# Patient Record
Sex: Male | Born: 1965 | Race: White | Hispanic: No | Marital: Single | Smoking: Never smoker
Health system: Southern US, Community
[De-identification: ages and names within clinical notes are randomized; demographics above are authoritative.]

## PROBLEM LIST (undated history)

## (undated) DIAGNOSIS — B1920 Unspecified viral hepatitis C without hepatic coma: Secondary | ICD-10-CM

## (undated) DIAGNOSIS — F329 Major depressive disorder, single episode, unspecified: Secondary | ICD-10-CM

## (undated) DIAGNOSIS — F32A Depression, unspecified: Secondary | ICD-10-CM

## (undated) DIAGNOSIS — K279 Peptic ulcer, site unspecified, unspecified as acute or chronic, without hemorrhage or perforation: Secondary | ICD-10-CM

## (undated) DIAGNOSIS — F319 Bipolar disorder, unspecified: Secondary | ICD-10-CM

## (undated) DIAGNOSIS — G709 Myoneural disorder, unspecified: Secondary | ICD-10-CM

## (undated) DIAGNOSIS — F509 Eating disorder, unspecified: Secondary | ICD-10-CM

## (undated) DIAGNOSIS — F419 Anxiety disorder, unspecified: Secondary | ICD-10-CM

## (undated) HISTORY — DX: Major depressive disorder, single episode, unspecified: F32.9

## (undated) HISTORY — DX: Eating disorder, unspecified: F50.9

## (undated) HISTORY — DX: Unspecified viral hepatitis C without hepatic coma: B19.20

## (undated) HISTORY — DX: Depression, unspecified: F32.A

## (undated) HISTORY — PX: TONSILLECTOMY: SUR1361

## (undated) HISTORY — DX: Anxiety disorder, unspecified: F41.9

## (undated) HISTORY — DX: Peptic ulcer, site unspecified, unspecified as acute or chronic, without hemorrhage or perforation: K27.9

---

## 1993-03-08 HISTORY — PX: OTHER SURGICAL HISTORY: SHX169

## 1996-03-08 HISTORY — PX: OTHER SURGICAL HISTORY: SHX169

## 1997-09-05 ENCOUNTER — Emergency Department (HOSPITAL_COMMUNITY): Admission: EM | Admit: 1997-09-05 | Discharge: 1997-09-05 | Payer: Self-pay | Admitting: Emergency Medicine

## 1997-09-08 ENCOUNTER — Inpatient Hospital Stay (HOSPITAL_COMMUNITY): Admission: EM | Admit: 1997-09-08 | Discharge: 1997-09-13 | Payer: Self-pay | Admitting: Emergency Medicine

## 1997-11-24 ENCOUNTER — Emergency Department (HOSPITAL_COMMUNITY): Admission: EM | Admit: 1997-11-24 | Discharge: 1997-11-24 | Payer: Self-pay | Admitting: Emergency Medicine

## 1998-06-20 ENCOUNTER — Emergency Department (HOSPITAL_COMMUNITY): Admission: EM | Admit: 1998-06-20 | Discharge: 1998-06-20 | Payer: Self-pay | Admitting: Emergency Medicine

## 1998-06-20 ENCOUNTER — Encounter: Payer: Self-pay | Admitting: Emergency Medicine

## 1998-09-15 ENCOUNTER — Encounter: Payer: Self-pay | Admitting: Gastroenterology

## 1998-09-15 ENCOUNTER — Ambulatory Visit (HOSPITAL_COMMUNITY): Admission: RE | Admit: 1998-09-15 | Discharge: 1998-09-15 | Payer: Self-pay | Admitting: Gastroenterology

## 1998-09-16 ENCOUNTER — Ambulatory Visit (HOSPITAL_COMMUNITY): Admission: RE | Admit: 1998-09-16 | Discharge: 1998-09-16 | Payer: Self-pay | Admitting: Gastroenterology

## 1998-09-17 ENCOUNTER — Emergency Department (HOSPITAL_COMMUNITY): Admission: EM | Admit: 1998-09-17 | Discharge: 1998-09-17 | Payer: Self-pay | Admitting: *Deleted

## 1998-09-22 ENCOUNTER — Inpatient Hospital Stay (HOSPITAL_COMMUNITY): Admission: EM | Admit: 1998-09-22 | Discharge: 1998-09-23 | Payer: Self-pay | Admitting: Emergency Medicine

## 1998-09-22 ENCOUNTER — Encounter: Payer: Self-pay | Admitting: *Deleted

## 1998-09-22 ENCOUNTER — Encounter: Payer: Self-pay | Admitting: Internal Medicine

## 1998-09-23 ENCOUNTER — Encounter: Payer: Self-pay | Admitting: Internal Medicine

## 1999-01-08 ENCOUNTER — Emergency Department (HOSPITAL_COMMUNITY): Admission: EM | Admit: 1999-01-08 | Discharge: 1999-01-08 | Payer: Self-pay

## 1999-08-19 ENCOUNTER — Ambulatory Visit (HOSPITAL_COMMUNITY): Admission: RE | Admit: 1999-08-19 | Discharge: 1999-08-19 | Payer: Self-pay | Admitting: Gastroenterology

## 1999-09-08 ENCOUNTER — Encounter: Payer: Self-pay | Admitting: Emergency Medicine

## 1999-09-08 ENCOUNTER — Emergency Department (HOSPITAL_COMMUNITY): Admission: EM | Admit: 1999-09-08 | Discharge: 1999-09-08 | Payer: Self-pay | Admitting: Emergency Medicine

## 2000-07-20 ENCOUNTER — Emergency Department (HOSPITAL_COMMUNITY): Admission: EM | Admit: 2000-07-20 | Discharge: 2000-07-21 | Payer: Self-pay | Admitting: Emergency Medicine

## 2000-09-26 ENCOUNTER — Encounter: Admission: RE | Admit: 2000-09-26 | Discharge: 2000-09-26 | Payer: Self-pay | Admitting: Gastroenterology

## 2000-09-26 ENCOUNTER — Encounter: Payer: Self-pay | Admitting: Gastroenterology

## 2001-09-16 ENCOUNTER — Emergency Department (HOSPITAL_COMMUNITY): Admission: EM | Admit: 2001-09-16 | Discharge: 2001-09-16 | Payer: Self-pay | Admitting: Emergency Medicine

## 2004-09-26 ENCOUNTER — Emergency Department (HOSPITAL_COMMUNITY): Admission: EM | Admit: 2004-09-26 | Discharge: 2004-09-27 | Payer: Self-pay | Admitting: Emergency Medicine

## 2005-09-14 ENCOUNTER — Emergency Department (HOSPITAL_COMMUNITY): Admission: EM | Admit: 2005-09-14 | Discharge: 2005-09-14 | Payer: Self-pay | Admitting: Emergency Medicine

## 2006-06-23 ENCOUNTER — Encounter: Payer: Self-pay | Admitting: Family Medicine

## 2006-09-18 ENCOUNTER — Inpatient Hospital Stay (HOSPITAL_COMMUNITY): Admission: EM | Admit: 2006-09-18 | Discharge: 2006-09-19 | Payer: Self-pay | Admitting: *Deleted

## 2006-10-01 ENCOUNTER — Inpatient Hospital Stay (HOSPITAL_COMMUNITY): Admission: EM | Admit: 2006-10-01 | Discharge: 2006-10-03 | Payer: Self-pay | Admitting: Emergency Medicine

## 2006-10-05 ENCOUNTER — Ambulatory Visit: Payer: Self-pay | Admitting: Gastroenterology

## 2006-10-11 ENCOUNTER — Encounter: Payer: Self-pay | Admitting: Family Medicine

## 2007-04-13 ENCOUNTER — Emergency Department (HOSPITAL_COMMUNITY): Admission: EM | Admit: 2007-04-13 | Discharge: 2007-04-13 | Payer: Self-pay | Admitting: Emergency Medicine

## 2007-07-04 ENCOUNTER — Encounter: Payer: Self-pay | Admitting: Family Medicine

## 2007-12-22 ENCOUNTER — Ambulatory Visit: Payer: Self-pay | Admitting: Family Medicine

## 2007-12-22 DIAGNOSIS — R634 Abnormal weight loss: Secondary | ICD-10-CM

## 2007-12-22 DIAGNOSIS — F411 Generalized anxiety disorder: Secondary | ICD-10-CM | POA: Insufficient documentation

## 2007-12-22 DIAGNOSIS — K279 Peptic ulcer, site unspecified, unspecified as acute or chronic, without hemorrhage or perforation: Secondary | ICD-10-CM | POA: Insufficient documentation

## 2007-12-23 ENCOUNTER — Encounter: Payer: Self-pay | Admitting: Family Medicine

## 2007-12-25 DIAGNOSIS — B182 Chronic viral hepatitis C: Secondary | ICD-10-CM

## 2007-12-25 LAB — CONVERTED CEMR LAB
AST: 18 units/L (ref 0–37)
Alkaline Phosphatase: 63 units/L (ref 39–117)
GC Probe Amp, Urine: NEGATIVE
HCT: 48.5 % (ref 39.0–52.0)
Platelets: 194 10*3/uL (ref 150–400)
Potassium: 4.9 meq/L (ref 3.5–5.3)
RBC: 5.08 M/uL (ref 4.22–5.81)
RDW: 12.3 % (ref 11.5–15.5)
Total Bilirubin: 0.6 mg/dL (ref 0.3–1.2)
VLDL: 11 mg/dL (ref 0–40)
WBC: 5.5 10*3/uL (ref 4.0–10.5)

## 2007-12-29 ENCOUNTER — Encounter: Payer: Self-pay | Admitting: Family Medicine

## 2008-01-04 LAB — CONVERTED CEMR LAB: HCV Quantitative: 198000 intl units/mL — ABNORMAL HIGH (ref <43)

## 2008-01-09 ENCOUNTER — Encounter: Payer: Self-pay | Admitting: Family Medicine

## 2008-01-11 ENCOUNTER — Ambulatory Visit (HOSPITAL_COMMUNITY): Payer: Self-pay | Admitting: Psychiatry

## 2008-01-18 ENCOUNTER — Ambulatory Visit (HOSPITAL_COMMUNITY): Payer: Self-pay | Admitting: Psychiatry

## 2008-02-13 ENCOUNTER — Ambulatory Visit (HOSPITAL_COMMUNITY): Payer: Self-pay | Admitting: Psychiatry

## 2008-02-22 ENCOUNTER — Ambulatory Visit: Payer: Self-pay | Admitting: Family Medicine

## 2008-02-22 DIAGNOSIS — R062 Wheezing: Secondary | ICD-10-CM

## 2008-02-22 DIAGNOSIS — M25519 Pain in unspecified shoulder: Secondary | ICD-10-CM | POA: Insufficient documentation

## 2008-03-12 ENCOUNTER — Ambulatory Visit: Payer: Self-pay | Admitting: Family Medicine

## 2008-03-19 ENCOUNTER — Ambulatory Visit (HOSPITAL_COMMUNITY): Payer: Self-pay | Admitting: Psychiatry

## 2008-03-21 ENCOUNTER — Ambulatory Visit: Payer: Self-pay | Admitting: Gastroenterology

## 2008-03-21 ENCOUNTER — Encounter: Payer: Self-pay | Admitting: Family Medicine

## 2008-04-02 ENCOUNTER — Encounter: Payer: Self-pay | Admitting: Family Medicine

## 2008-04-02 ENCOUNTER — Ambulatory Visit (HOSPITAL_COMMUNITY): Payer: Self-pay | Admitting: Psychiatry

## 2008-04-29 ENCOUNTER — Ambulatory Visit (HOSPITAL_COMMUNITY): Payer: Self-pay | Admitting: Psychiatry

## 2008-05-13 ENCOUNTER — Ambulatory Visit (HOSPITAL_COMMUNITY): Payer: Self-pay | Admitting: Psychiatry

## 2008-06-25 ENCOUNTER — Ambulatory Visit (HOSPITAL_COMMUNITY): Payer: Self-pay | Admitting: Psychiatry

## 2008-07-08 ENCOUNTER — Ambulatory Visit (HOSPITAL_COMMUNITY): Payer: Self-pay | Admitting: Psychiatry

## 2008-08-31 ENCOUNTER — Ambulatory Visit: Payer: Self-pay | Admitting: Interventional Radiology

## 2008-08-31 ENCOUNTER — Emergency Department (HOSPITAL_BASED_OUTPATIENT_CLINIC_OR_DEPARTMENT_OTHER): Admission: EM | Admit: 2008-08-31 | Discharge: 2008-08-31 | Payer: Self-pay | Admitting: Emergency Medicine

## 2008-10-07 ENCOUNTER — Ambulatory Visit (HOSPITAL_COMMUNITY): Payer: Self-pay | Admitting: Psychiatry

## 2008-10-30 ENCOUNTER — Ambulatory Visit (HOSPITAL_COMMUNITY): Payer: Self-pay | Admitting: Psychiatry

## 2008-11-25 ENCOUNTER — Ambulatory Visit (HOSPITAL_COMMUNITY): Payer: Self-pay | Admitting: Psychiatry

## 2008-12-16 ENCOUNTER — Telehealth (INDEPENDENT_AMBULATORY_CARE_PROVIDER_SITE_OTHER): Payer: Self-pay | Admitting: *Deleted

## 2008-12-18 ENCOUNTER — Encounter: Admission: RE | Admit: 2008-12-18 | Discharge: 2008-12-18 | Payer: Self-pay | Admitting: Family Medicine

## 2008-12-18 ENCOUNTER — Ambulatory Visit: Payer: Self-pay | Admitting: Family Medicine

## 2008-12-18 DIAGNOSIS — M79609 Pain in unspecified limb: Secondary | ICD-10-CM

## 2008-12-19 LAB — CONVERTED CEMR LAB
AST: 22 units/L (ref 0–37)
Albumin: 4.9 g/dL (ref 3.5–5.2)
Alkaline Phosphatase: 70 units/L (ref 39–117)
BUN: 9 mg/dL (ref 6–23)
CO2: 24 meq/L (ref 19–32)
Calcium: 9.7 mg/dL (ref 8.4–10.5)
Chloride: 106 meq/L (ref 96–112)
Cholesterol: 139 mg/dL (ref 0–200)
Creatinine, Ser: 0.96 mg/dL (ref 0.40–1.50)
Glucose, Bld: 87 mg/dL (ref 70–99)
HDL: 50 mg/dL (ref >39)
INR: 1.1 (ref 0.0–1.5)
LDL Cholesterol: 80 mg/dL (ref 0–99)
MCHC: 33.3 g/dL (ref 30.0–36.0)
MCV: 95.2 fL (ref 78.0–100.0)
Potassium: 4.6 meq/L (ref 3.5–5.3)
Prothrombin Time: 13.6 s (ref 11.6–15.2)
RBC: 4.8 M/uL (ref 4.22–5.81)
Total Bilirubin: 0.8 mg/dL (ref 0.3–1.2)
Total CHOL/HDL Ratio: 2.8
Total Protein: 7.3 g/dL (ref 6.0–8.3)
VLDL: 9 mg/dL (ref 0–40)

## 2009-01-06 ENCOUNTER — Ambulatory Visit (HOSPITAL_COMMUNITY): Payer: Self-pay | Admitting: Psychiatry

## 2009-01-09 ENCOUNTER — Ambulatory Visit: Payer: Self-pay | Admitting: Gastroenterology

## 2009-01-09 ENCOUNTER — Encounter: Payer: Self-pay | Admitting: Family Medicine

## 2009-02-25 ENCOUNTER — Ambulatory Visit (HOSPITAL_COMMUNITY): Payer: Self-pay | Admitting: Psychiatry

## 2009-02-26 ENCOUNTER — Telehealth: Payer: Self-pay | Admitting: Family Medicine

## 2009-04-08 ENCOUNTER — Ambulatory Visit (HOSPITAL_COMMUNITY): Payer: Self-pay | Admitting: Psychiatry

## 2009-07-01 ENCOUNTER — Ambulatory Visit (HOSPITAL_COMMUNITY): Payer: Self-pay | Admitting: Licensed Clinical Social Worker

## 2009-07-07 ENCOUNTER — Ambulatory Visit (HOSPITAL_COMMUNITY): Payer: Self-pay | Admitting: Psychiatry

## 2009-07-31 ENCOUNTER — Encounter: Payer: Self-pay | Admitting: Family Medicine

## 2009-07-31 ENCOUNTER — Ambulatory Visit: Payer: Self-pay | Admitting: Gastroenterology

## 2009-08-12 ENCOUNTER — Ambulatory Visit (HOSPITAL_COMMUNITY): Payer: Self-pay | Admitting: Licensed Clinical Social Worker

## 2009-10-08 ENCOUNTER — Ambulatory Visit (HOSPITAL_COMMUNITY): Payer: Self-pay | Admitting: Psychiatry

## 2009-11-28 ENCOUNTER — Emergency Department (HOSPITAL_BASED_OUTPATIENT_CLINIC_OR_DEPARTMENT_OTHER): Admission: EM | Admit: 2009-11-28 | Discharge: 2009-11-28 | Payer: Self-pay | Admitting: Emergency Medicine

## 2009-11-28 ENCOUNTER — Ambulatory Visit: Payer: Self-pay | Admitting: Diagnostic Radiology

## 2009-12-10 ENCOUNTER — Ambulatory Visit: Payer: Self-pay | Admitting: Family Medicine

## 2009-12-10 DIAGNOSIS — K219 Gastro-esophageal reflux disease without esophagitis: Secondary | ICD-10-CM

## 2009-12-10 DIAGNOSIS — R109 Unspecified abdominal pain: Secondary | ICD-10-CM | POA: Insufficient documentation

## 2009-12-10 LAB — CONVERTED CEMR LAB
Blood in Urine, dipstick: NEGATIVE
Nitrite: NEGATIVE
Urobilinogen, UA: 0.2
pH: 7.5

## 2009-12-11 ENCOUNTER — Encounter: Payer: Self-pay | Admitting: Family Medicine

## 2009-12-11 LAB — CONVERTED CEMR LAB
ALT: 18 units/L (ref 0–53)
Albumin: 4.4 g/dL (ref 3.5–5.2)
Alkaline Phosphatase: 71 units/L (ref 39–117)
Hemoglobin: 14.3 g/dL (ref 13.0–17.0)
RBC: 4.56 M/uL (ref 4.22–5.81)

## 2010-01-06 ENCOUNTER — Ambulatory Visit (HOSPITAL_COMMUNITY): Payer: Self-pay | Admitting: Psychiatry

## 2010-01-07 ENCOUNTER — Ambulatory Visit (HOSPITAL_COMMUNITY): Payer: Self-pay | Admitting: Licensed Clinical Social Worker

## 2010-01-12 ENCOUNTER — Telehealth: Payer: Self-pay | Admitting: Family Medicine

## 2010-01-22 ENCOUNTER — Telehealth: Payer: Self-pay | Admitting: Family Medicine

## 2010-02-10 ENCOUNTER — Ambulatory Visit (HOSPITAL_COMMUNITY): Payer: Self-pay | Admitting: Licensed Clinical Social Worker

## 2010-02-20 ENCOUNTER — Telehealth: Payer: Self-pay | Admitting: Family Medicine

## 2010-02-23 ENCOUNTER — Telehealth: Payer: Self-pay | Admitting: Family Medicine

## 2010-03-20 ENCOUNTER — Encounter: Payer: Self-pay | Admitting: Family Medicine

## 2010-04-09 NOTE — Progress Notes (Signed)
Summary: Hep C Clinic requirements  Phone Note From Other Clinic   Caller: Dr.Nunez- [place] Clinic in [place] Call For: [place] Summary of Call: Dr. Leonard Schwartz- I called [facility] and they will not even schedule this pt until he has had specific labs done. They want him to have a HCV RNA and Platelet count up to date to send to them. Has he had any of these? Initial call taken by: Kathlene November LPN,  February 23, 2010 4:05 PM  Follow-up for Phone Call        I printed them already Follow-up by: Seymour Bars DO,  February 23, 2010 4:07 PM

## 2010-04-09 NOTE — Consult Note (Signed)
Summary: [facility]  [facility]   Imported By: Lanelle Bal 08/15/2009 13:56:29  _____________________________________________________________________  External Attachment:    Type:   Image     Comment:   External Document

## 2010-04-09 NOTE — Progress Notes (Signed)
Summary: Hep C clinic   Phone Note Call from Patient   Caller: Patient Summary of Call: Pt states he would like referral to Hep C clinic at [facility]. # [phone] Pt spoke w/ Crystal and she stated he just needs a referral from [place]. Please advise. Initial call taken by: Payton Spark CMA,  February 20, 2010 12:01 PM     Appended Document: Hep C clinic  02/23/2010- Called [facility]. They said they will need a current HCV RNA and a platlet count labs done on him before they will even schedule this patient with Dr. Wyvonnia Lora. KJ LPN  Appended Document: Hep C clinic

## 2010-04-09 NOTE — Progress Notes (Signed)
Summary: Hep C clinic  Phone Note Call from Patient Call back at Home Phone [phone]   Caller: Patient Call For: Jordan Bars DO Summary of Call: Been going to [facility] for his Hep C. Pt states no one from there office will call him back and he wants to know if there is another one he can see- he is frustrated that can not get in touch with them to get in for his appts. Initial call taken by: Kathlene November LPN,  January 12, 2010 10:01 AM  Follow-up for Phone Call        can we help set up his follow up?  He's already been seen at the Hep C clinic in [place] and they don't want to see him back.   Follow-up by: Jordan Bars DO,  January 12, 2010 10:26 AM  Additional Follow-up for Phone Call Additional follow up Details #1::        Called the [place] location and they informed me that they only have 1 MD that comes from [facility] right now and that the wait is long but that the patient could opt to go to [facility] office and that she would call the pt today and schedule him.  I called pt mysel to let him know to be expecting a call from their office today. Additional Follow-up by: Kathlene November LPN,  January 12, 2010 1:05 PM

## 2010-04-09 NOTE — Assessment & Plan Note (Signed)
Summary: GERD   Vital Signs:  Patient profile:   45 year old male Height:      73.25 inches Weight:      173 pounds BMI:     22.75 O2 Sat:      100 % on Room air Pulse rate:   80 / minute BP sitting:   132 / 85  (right arm) Cuff size:   regular  Vitals Entered By: Payton Spark CMA (December 10, 2009 1:52 PM)  O2 Flow:  Room air CC: F/U meds and GI issues. Also c/o nostril pain and R side pain.   Primary Care Provider:  Seymour Bars DO  CC:  F/U meds and GI issues. Also c/o nostril pain and R side pain.Marland Kitchen  History of Present Illness: 45 yo WM presents for f/u PUD.  He had been seeing Dr Loreta Ave and he was relaesed as a patient over perscription problems.  He has been taking Dexilant and it has been making him vomit some.  He has worsening dyspepsia from anxiety.  He still has problems eating.  His weight has been fairly stable.    He is seeing Dr Christell Constant and Merlene Morse for his mood.   He left Sheralyn Boatman, his long term girlfriend but he is enjoying spending time with his son, staying active coaching football.    Current Medications (verified): 1)  Dexilant 30 Mg Cpdr (Dexlansoprazole) .... Take 1 Tab By Mouth Once Daily 2)  Alprazolam 1 Mg Tabs (Alprazolam) .Marland Kitchen.. 1 Tab By Mouth Once Daily Prn 3)  Seroquel 25 Mg Tabs (Quetiapine Fumarate) .Marland Kitchen.. 1 Tab By Mouth Qhs 4)  Wellbutrin Xl 300 Mg Xr24h-Tab (Bupropion Hcl) .... Take 1 Tab By Mouth Once Daily 5)  Promethazine Hcl 25 Mg Supp (Promethazine Hcl) .Marland Kitchen.. 1 Suppository Pr Q 6 Hrs As Needed Nausea  Allergies (verified): No Known Drug Allergies  Past History:  Past Medical History: Reviewed history from 12/22/2007 and no changes required. 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 ruptured gastric ulcer 1998 R hand surgery 1995  Social History: Reviewed history from 12/22/2007 and no changes  required. on disability. not in any relationships. has 2 sons - youngest son lives with him and oldest son in [place]. uses THC and snuff. Hx of cocaine use but no IVDU. No regular exercise.  Review of Systems      See HPI  Physical Exam  General:  alert, well-developed, well-nourished, and well-hydrated.   Head:  normocephalic and atraumatic.   Eyes:  sclera non icteric Mouth:  pharynx pink and moist and fair dentition.   Neck:  no masses.   Lungs:  Normal respiratory effort, chest expands symmetrically. Lungs are clear to auscultation, no crackles or wheezes. Heart:  Normal rate and regular rhythm. S1 and S2 normal without gallop, murmur, click, rub or other extra sounds. Abdomen:  soft, no epigastric TTP or vol guarding,  No HSM.  ND.  R flank TTP Extremities:  no LE edema Neurologic:  gait normal.   Skin:  color normal.   Cervical Nodes:  No lymphadenopathy noted Psych:  good eye contact, not anxious appearing, and not depressed appearing.     Impression & Recommendations:  Problem # 1:  GERD, SEVERE (ICD-530.81) Long term problem, worsened by anxiety and stress.  Has tried many PPIs and has been scoped by Dr Loreta Ave w/ hx of PUD.  No  epigastric tenderness today but is having some vomitting even with Dexilant.  Will change him to Nexium once daily + reflux precautions.  Offered a consultation with [facility] re: the possibility of a lap nissen fundoplication.  He is interested in persuing this.   His updated medication list for this problem includes:    Nexium 40 Mg Cpdr (Esomeprazole magnesium) .Marland Kitchen... 1 capsule by mouth daily  Orders: Surgical Referral (Surgery)  Problem # 2:  FLANK PAIN, RIGHT (ICD-789.09)  UA is normal.  Likely to be MSK pain given hx.  Will treat with Flexeril only.  Has Hep C and PUD so cannot take APAP or NSAIDs. His updated medication list for this problem includes:    Flexeril 10 Mg Tabs (Cyclobenzaprine hcl) .Marland Kitchen... 1 tab by mouth three times a day as  needed back pain  Orders: UA Dipstick w/o Micro (automated)  (81003)  Problem # 3:  HEPATITIS C CARRIER (ICD-V02.62) Check labs todayl. Orders: T-Comprehensive Metabolic Panel [id number])  Problem # 4:  ANXIETY STATE, UNSPECIFIED (ICD-300.00) Continue treatment with Dr Christell Constant and Merlene Morse. His updated medication list for this problem includes:    Alprazolam 1 Mg Tabs (Alprazolam) .Marland Kitchen... 1 tab by mouth once daily prn    Wellbutrin Xl 300 Mg Xr24h-tab (Bupropion hcl) .Marland Kitchen... Take 1 tab by mouth once daily  Complete Medication List: 1)  Nexium 40 Mg Cpdr (Esomeprazole magnesium) .Marland Kitchen.. 1 capsule by mouth daily 2)  Alprazolam 1 Mg Tabs (Alprazolam) .Marland Kitchen.. 1 tab by mouth once daily prn 3)  Seroquel 25 Mg Tabs (Quetiapine fumarate) .Marland Kitchen.. 1 tab by mouth qhs 4)  Wellbutrin Xl 300 Mg Xr24h-tab (Bupropion hcl) .... Take 1 tab by mouth once daily 5)  Promethazine Hcl 25 Mg Supp (Promethazine hcl) .Marland Kitchen.. 1 suppository pr q 6 hrs as needed nausea 6)  Flexeril 10 Mg Tabs (Cyclobenzaprine hcl) .Marland Kitchen.. 1 tab by mouth three times a day as needed back pain  Other Orders: T-CBC No Diff ([id number]) Flu Vaccine 40yrs + MEDICARE PATIENTS (H0865) Administration Flu vaccine - MCR (H8469)  Patient Instructions: 1)  Change Dexilant to Nexium once daily for acid reflux. 2)  Stick to 3-4 small meals each day. 3)  Continue to see Dr Christell Constant and Merlene Morse for medical managment of depression/ anxiety. 4)  Referral made to surgeon here re: lap nissen procedure for severe reflux.   5)  UA is normal.  Back pain is MSK. 6)  Use heating pad and flexeril as needed for pain. 7)  REturn for follow up in 4 mos. Prescriptions: FLEXERIL 10 MG TABS (CYCLOBENZAPRINE HCL) 1 tab by mouth three times a day as needed back pain  #40 x 0   Entered and Authorized by:   Seymour Bars DO   Signed by:   Seymour Bars DO on 12/10/2009   Method used:   [facility]  [address] [phone]ail[facility]       [address]       [place], [place]  [address]       Ph: [phone] or [phone]       Fax: [phone]   RxID:   [id number] NEXIUM 40 MG CPDR (ESOMEPRAZOLE MAGNESIUM) 1 capsule by mouth daily  #30 x 6   Entered and Authorized by:   Seymour Bars DO   Signed by:   Seymour Bars DO on 12/10/2009   Method used:   [facility]        [facility]  [address] [phone]* (retail)       [address]       [place], [place]  [address]       Ph: [phone] or [phone]       Fax: [phone]   RxID:   [id number]  Flu Vaccine Consent Questions     Do you have a history of severe allergic reactions to this vaccine? no    Any prior history of allergic reactions to egg and/or gelatin? no    Do you have a sensitivity to the preservative Thimersol? no    Do you have a past history of Guillan-Barre Syndrome? no    Do you currently have an acute febrile illness? no    Have you ever had a severe reaction to latex? no    Vaccine information given and explained to patient? yes    Are you currently pregnant? no    Lot Number:[id number]   Exp Date:09/05/2010   Site Given  Left Deltoid [id number]       Fax: [phone]   RxID:   [id number]   .lbmedflu Laboratory Results   Urine Tests    Routine Urinalysis   Color: yellow Appearance: Clear Glucose: negative   (Normal Range: Negative) Bilirubin: negative   (Normal Range: Negative) Ketone: negative   (Normal Range: Negative) Spec. Gravity: 1.020   (Normal Range: 1.003-1.035) Blood: negative   (Normal Range: Negative) pH: 7.5   (Normal Range: 5.0-8.0) Protein: negative   (Normal Range: Negative) Urobilinogen: 0.2   (Normal Range: 0-1) Nitrite: negative   (Normal Range: Negative) Leukocyte Esterace: negative   (Normal Range: Negative)

## 2010-04-09 NOTE — Consult Note (Signed)
Summary: [facility]  [facility]   Imported By: Lanelle Bal 04/02/2010 11:19:42  _____________________________________________________________________  External Attachment:    Type:   Image     Comment:   External Document

## 2010-04-09 NOTE — Progress Notes (Signed)
Summary: Hep C   Phone Note Call from Patient Call back at Home Phone [phone]   Caller: Patient Call For: Seymour Bars DO Summary of Call: Pt calls and is being seen at Hep C clinic through [facility] but they only have 1 doctor that comes to [facility] and they don't know when he will be able to be seen. He wants to know if there is another clinic in [place] or through [place] that he can be seen at sooner to start his treatment Initial call taken by: Kathlene November LPN,  January 22, 2010 1:02 PM  Follow-up for Phone Call        unfortunately, there is only one Hep C clinic in [place] and the other GI docs do not do Hep C treatments thru their offices.   Follow-up by: Seymour Bars DO,  January 23, 2010 8:46 AM     Appended Document: Hep C  01/23/2010 @ 9:03am- Pt notified of MD instructions. KJ LPN

## 2010-04-13 ENCOUNTER — Encounter (INDEPENDENT_AMBULATORY_CARE_PROVIDER_SITE_OTHER): Payer: Medicare Other | Admitting: Psychiatry

## 2010-04-13 DIAGNOSIS — F39 Unspecified mood [affective] disorder: Secondary | ICD-10-CM

## 2010-04-14 ENCOUNTER — Encounter: Payer: Self-pay | Admitting: Family Medicine

## 2010-04-14 ENCOUNTER — Ambulatory Visit (INDEPENDENT_AMBULATORY_CARE_PROVIDER_SITE_OTHER): Payer: Medicare Other | Admitting: Family Medicine

## 2010-04-14 ENCOUNTER — Encounter (INDEPENDENT_AMBULATORY_CARE_PROVIDER_SITE_OTHER): Payer: Medicare Other | Admitting: Licensed Clinical Social Worker

## 2010-04-14 DIAGNOSIS — F39 Unspecified mood [affective] disorder: Secondary | ICD-10-CM

## 2010-04-14 DIAGNOSIS — F411 Generalized anxiety disorder: Secondary | ICD-10-CM

## 2010-04-14 DIAGNOSIS — K219 Gastro-esophageal reflux disease without esophagitis: Secondary | ICD-10-CM

## 2010-04-14 DIAGNOSIS — B182 Chronic viral hepatitis C: Secondary | ICD-10-CM

## 2010-04-15 LAB — CONVERTED CEMR LAB
Cholesterol: 160 mg/dL (ref 0–200)
LDL Cholesterol: 100 mg/dL — ABNORMAL HIGH (ref 0–99)
Total CHOL/HDL Ratio: 3.4
VLDL: 13 mg/dL (ref 0–40)

## 2010-04-23 ENCOUNTER — Encounter: Payer: Self-pay | Admitting: Family Medicine

## 2010-04-23 NOTE — Assessment & Plan Note (Signed)
Summary: f/u GERD   Vital Signs:  Patient profile:   45 year old male Height:      73.25 inches Weight:      182 pounds BMI:     23.93 O2 Sat:      99 % on Room air Pulse rate:   94 / minute BP sitting:   135 / 85  (left arm) Cuff size:   regular  Vitals Entered By: Payton Spark CMA (April 14, 2010 2:19 PM)  O2 Flow:  Room air CC: F/U   Primary Care Provider:  Seymour Bars DO  CC:  F/U.  History of Present Illness: 45 yo WM presents for f/u severe GERD and hx of PUD.  He is happy that he has gained weight.  Appetite has improved.  He has less abd pain but less vomitting from stress.  Improved when he increased his Nexium to two times a day.  Denies melena or hematochezia.    He had liver biopsy by Hep C clinic at [facility].  He is waiting for the results.  They think that he is 'not that bad'.    His mood is stable and he is seeing both Dr Christell Constant and Merlene Morse.  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 on disability. not in any relationships. has 2 sons - youngest son lives with him and oldest son in [place]. uses THC and snuff. Hx of cocaine use but no IVDU. No regular exercise.  Review of Systems      See HPI  Physical Exam  General:  alert, well-developed, well-nourished, and well-hydrated.   Eyes:  sclera non icteric Mouth:  pharynx pink and moist.   Neck:  no masses.   Lungs:  Normal respiratory effort, chest expands symmetrically. Lungs are clear to auscultation, no crackles or wheezes. Heart:  Normal rate and regular rhythm. S1 and S2 normal without gallop, murmur, click, rub or other extra sounds. Abdomen:  mild epigastric TTP, soft, normal bowel sounds, no distention, no hepatomegaly, and no splenomegaly.   Extremities:  no LE edema Skin:  color  normal.  no jaundice Psych:  good eye contact, not anxious appearing, and not depressed appearing.     Impression & Recommendations:  Problem # 1:  GERD, SEVERE (ICD-530.81) Assessment Improved Improved with stress reduction and adding Nexium two times a day.   The following medications were removed from the medication list:    Nexium 40 Mg Cpdr (Esomeprazole magnesium) .Marland Kitchen... 1 capsule by mouth daily His updated medication list for this problem includes:    Nexium 40 Mg Cpdr (Esomeprazole magnesium) .Marland Kitchen... 1 capsule by mouth bid  Problem # 2:  HEPATITIS C CARRIER (ICD-V02.62) Seeing Hepatology clinic at [facility] and had a biopsy done last wk, waiting for results.  Reviewed the labs done by Hep C clinic, all normal.    Problem # 3:  ANXIETY STATE, UNSPECIFIED (ICD-300.00) Assessment: Improved Improved on tretment per [facility].  Continue. His updated medication list for this problem includes:    Alprazolam 1 Mg Tabs (Alprazolam) .Marland Kitchen... 1 tab by mouth once daily prn    Wellbutrin Xl 300 Mg Xr24h-tab (Bupropion hcl) .Marland Kitchen... Take 1 tab by mouth once daily  Complete Medication List: 1)  Alprazolam 1 Mg Tabs (Alprazolam) .Marland Kitchen.. 1 tab by mouth once daily  prn 2)  Seroquel 25 Mg Tabs (Quetiapine fumarate) .Marland Kitchen.. 1 tab by mouth qhs 3)  Wellbutrin Xl 300 Mg Xr24h-tab (Bupropion hcl) .... Take 1 tab by mouth once daily 4)  Promethazine Hcl 25 Mg Supp (Promethazine hcl) .Marland Kitchen.. 1 suppository pr q 6 hrs as needed nausea 5)  Flexeril 10 Mg Tabs (Cyclobenzaprine hcl) .Marland Kitchen.. 1 tab by mouth three times a day as needed back pain 6)  Nexium 40 Mg Cpdr (Esomeprazole magnesium) .Marland Kitchen.. 1 capsule by mouth bid  Other Orders: T-Lipid Profile ([id number])  Patient Instructions: 1)  Keep up the good work.   2)  Stay on Nexium two times a day and update labs. 3)  Return for a PHYSICAL in 6 mos. Prescriptions: NEXIUM 40 MG CPDR (ESOMEPRAZOLE MAGNESIUM) 1 capsule by mouth bid  #60 x 6   Entered and Authorized by:   Seymour Bars DO   Signed by:   Seymour Bars DO on 04/14/2010   Method used:   [facility]        [facility]  [address] [phone]* (retail)       [address]       [place], [place]  [address]       Ph: [phone] or [phone]       Fax: [phone]   RxID:   [id number]    Orders Added: 1)  T-Lipid Profile [phone] 2)  Est. Patient Level III [43[address]]

## 2010-05-14 NOTE — Letter (Signed)
Summary: [facility]  [facility]   Imported By: Lanelle Bal 05/07/2010 12:02:25  _____________________________________________________________________  External Attachment:    Type:   Image     Comment:   External Document

## 2010-05-21 LAB — URINALYSIS, ROUTINE W REFLEX MICROSCOPIC
Glucose, UA: NEGATIVE mg/dL
Leukocytes, UA: NEGATIVE
Protein, ur: 30 mg/dL — AB
Urobilinogen, UA: 4 mg/dL — ABNORMAL HIGH (ref 0.0–1.0)

## 2010-05-21 LAB — COMPREHENSIVE METABOLIC PANEL
AST: 25 U/L (ref 0–37)
CO2: 29 mEq/L (ref 19–32)
GFR calc Af Amer: >60 mL/min (ref >60)
Glucose, Bld: 120 mg/dL — ABNORMAL HIGH (ref 70–99)
Total Protein: 7.7 g/dL (ref 6.0–8.3)

## 2010-05-21 LAB — DIFFERENTIAL
Basophils Absolute: 0.1 10*3/uL (ref 0.0–0.1)
Eosinophils Absolute: 0 10*3/uL (ref 0.0–0.7)
Eosinophils Relative: 0 % (ref 0–5)
Lymphs Abs: 1.5 10*3/uL (ref 0.7–4.0)
Monocytes Absolute: 0.7 10*3/uL (ref 0.1–1.0)
Monocytes Relative: 8 % (ref 3–12)

## 2010-05-21 LAB — CBC
HCT: 44.7 % (ref 39.0–52.0)
Hemoglobin: 15.3 g/dL (ref 13.0–17.0)
MCH: 32.1 pg (ref 26.0–34.0)
MCHC: 34.1 g/dL (ref 30.0–36.0)
Platelets: 339 10*3/uL (ref 150–400)
RBC: 4.75 MIL/uL (ref 4.22–5.81)
WBC: 9 10*3/uL (ref 4.0–10.5)

## 2010-05-21 LAB — LIPASE, BLOOD: Lipase: 42 U/L (ref 23–300)

## 2010-05-21 LAB — URINE MICROSCOPIC-ADD ON

## 2010-06-15 LAB — BASIC METABOLIC PANEL
BUN: 7 mg/dL (ref 6–23)
Calcium: 9.2 mg/dL (ref 8.4–10.5)
GFR calc Af Amer: >60 mL/min (ref >60)
Glucose, Bld: 97 mg/dL (ref 70–99)
Potassium: 4 mEq/L (ref 3.5–5.1)

## 2010-06-15 LAB — URINALYSIS, ROUTINE W REFLEX MICROSCOPIC
Hgb urine dipstick: NEGATIVE
Nitrite: NEGATIVE
Protein, ur: NEGATIVE mg/dL
pH: 8 (ref 5.0–8.0)

## 2010-06-15 LAB — CBC
MCHC: 32.9 g/dL (ref 30.0–36.0)
RBC: 4.64 MIL/uL (ref 4.22–5.81)
RDW: 11.3 % — ABNORMAL LOW (ref 11.5–15.5)
WBC: 14.2 10*3/uL — ABNORMAL HIGH (ref 4.0–10.5)

## 2010-06-15 LAB — DIFFERENTIAL
Monocytes Relative: 7 % (ref 3–12)
Neutro Abs: 11.7 10*3/uL — ABNORMAL HIGH (ref 1.7–7.7)

## 2010-07-01 ENCOUNTER — Telehealth: Payer: Self-pay | Admitting: Family Medicine

## 2010-07-01 DIAGNOSIS — K219 Gastro-esophageal reflux disease without esophagitis: Secondary | ICD-10-CM

## 2010-07-01 MED ORDER — ESOMEPRAZOLE MAGNESIUM 40 MG PO CPDR: 40.0000 mg | DELAYED_RELEASE_CAPSULE | Freq: Two times a day (BID) | ORAL | Status: DC

## 2010-07-01 MED ORDER — CLOBETASOL PROP EMOLLIENT BASE 0.05 % EX CREA: TOPICAL_CREAM | CUTANEOUS | Status: DC

## 2010-07-01 NOTE — Telephone Encounter (Signed)
Pt notified that his prescriptions (Nexium and Clobetasol cream was sent to his pharmacy.)

## 2010-07-01 NOTE — Telephone Encounter (Signed)
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  Will treat with Clobetasol cream (this is a steroid) to apply at bedtime.  Can wear white cotton socks on his hands to help cream penetrate his skin.  Use this for up to 2 wks and apply the Aquaphor during the day.  If not improved after 2 wks, he will need an appt.  Clobetasol sent to pharmacy to pick up.

## 2010-07-01 NOTE — Telephone Encounter (Signed)
Pt call and left message for triage nurse that he needed his Nexium 40 mg PO BID #60/[id number].  [facility] after [facility] reviewed.  Pt was notified to check with his pharmacy later today to go and pick it up.  Pt also requests a cream for his hands bilaterally  due to  Drying/cracking.  Problem X 20 years off/on and has used cortisone in the past but was prescribed by different provider whom he did not mention during phone call. No hiving or rash.  Has been using Eucerin cream and takes care of comfort issues but does not make problem go away.  Pt states, " I would like to see problem go away."  We haven't seen patient for this problem in the past.  Chart reviewed.  Please advise if appt is needed or if we can call patient a prescription for this problem. Triage/SHT

## 2010-07-13 ENCOUNTER — Encounter (INDEPENDENT_AMBULATORY_CARE_PROVIDER_SITE_OTHER): Payer: Medicare Other | Admitting: Psychiatry

## 2010-07-13 DIAGNOSIS — F319 Bipolar disorder, unspecified: Secondary | ICD-10-CM

## 2010-07-14 ENCOUNTER — Encounter (HOSPITAL_COMMUNITY): Payer: Medicare Other | Admitting: Licensed Clinical Social Worker

## 2010-07-16 ENCOUNTER — Encounter (INDEPENDENT_AMBULATORY_CARE_PROVIDER_SITE_OTHER): Payer: Medicare Other | Admitting: Licensed Clinical Social Worker

## 2010-07-16 DIAGNOSIS — F39 Unspecified mood [affective] disorder: Secondary | ICD-10-CM

## 2010-07-20 ENCOUNTER — Encounter (HOSPITAL_COMMUNITY): Payer: Medicare Other | Admitting: Psychiatry

## 2010-07-21 NOTE — H&P (Signed)
NAME:  Jordan Herrera, Jordan Herrera NO.:  [id number]   MEDICAL RECORD NO.:  [id number]          PATIENT TYPE:  INP   LOCATION:  5735                         FACILITY:  [facility]   PHYSICIAN:  Malcolm T. Russella Dar, MD, FACGDATE OF BIRTH:  1965/06/02   DATE OF ADMISSION:  10/01/2006  DATE OF DISCHARGE:                              HISTORY & PHYSICAL   CHIEF COMPLAINT:  Recurrent nausea and vomiting.   HISTORY:  This is a 45 year old white male who has history of chronic  recurrent nausea and vomiting who is followed by Dr. Loreta Ave.  He has a  prior history of ulcer disease the beginning at age 36.  He underwent a  repair of a perforated gastric ulcer and what sounds like a vagotomy in  1998.  He says ever since then he has had frequent episodes of vomiting.  Gastric emptying scan in 2000 and then again September 19, 2006, were both  normal.  The most recent one showed 7% retention at 2 hours.  He says  that he has recently had nausea and vomiting over the past 3 days and  after multiple episodes of vomiting, vomited up a small amount of coffee-  ground emesis; also complained of increased heartburn.  He had not had  any bowel movements for the past 2 days; no recent fever or chills; no  melena, hematochezia or abdominal pain.  He says that whenever he gets  stressed or things do not go right for him, or something happens that he  cannot change, he has vomiting as his response.  He had does admit that  he stays anxious and says he feels like [facility] is a safe  place for him.  He is followed at [facility].  He does not  have a primary MD.  He reports that he had an endoscopy with Dr. Loreta Ave 3-  4 months ago as an outpatient.   CURRENT MEDICATIONS:  1. Zegerid 40 b.i.d.  2. Prozac 30 daily.  3. Xanax 1 mg b.i.d.  4. Phenergan t.i.d.   ALLERGIES:  No known drug allergies.   PAST HISTORY:  As outlined above.  He also has chronic anxiety and  depression.  No other chronic  medical problems.   FAMILY HISTORY:  Negative for GI disease.   SOCIAL HISTORY:  The patient lives with his girlfriend, I believe.  He  is a nonsmoker, denies any alcohol use.  He does use marijuana  occasionally.   REVIEW OF SYSTEMS:  CARDIOVASCULAR:  No chest pain or anginal symptoms.  PULMONARY:  Negative for cough, shortness of breath or sputum  production.  GENITOURINARY:  Negative for dysuria or frequency.  GI:  As  outlined above.  NEURO:  Pertinent for chronic anxiety.  All other  review of systems negative.   PHYSICAL EXAMINATION:  Well-developed, anxious, thin white male in no  acute distress.  He has extensive tattoos in the upper body and left  upper extremity.  He is alert and oriented x3.  Temperature is 97, blood  pressure 170/100, pulse is 65, respirations 18.  HEENT:  Nontraumatic, normocephalic.  EOMI, PERRLA.  Sclerae anicteric.  NECK:  Supple without nodes.  CARDIOVASCULAR:  Regular rate and rhythm with S1 and S2.  No murmur, rub  or gallop.  PULMONARY:  Clear to A&P.  ABDOMEN:  With well-healed midline incisional scar.  Soft, nondistended,  nontender.  Bowel sounds are active.  There is no succussion splash.  No  palpable hepatosplenomegaly or mass.  EXTREMITIES:  Without clubbing, cyanosis or edema.  NEUROLOGIC:  Grossly nonfocal.  He is alert and oriented x3 but anxious  and a somewhat tangential historian.  Otherwise, grossly nonfocal.  RECTAL:  Exam not done at the time of admission.  SKIN:  Pertinent for multiple tattoos.   LABORATORIES:  WBC of 13.9, hemoglobin 16.  Potassium 2.9.  Liver  function studies within normal limits.  Lipase 16.  KUB was negative for  obstruction.   IMPRESSION:  41. A 45 year old white male with recurrent nausea and vomiting with      small-volume hematemesis, suspect secondary to esophagitis or retch      gastropathy with multiple episodes of nausea and vomiting.  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  This does not seem to have a cyclic      component to it to suggest cyclic vomiting or abdominal migraine.  2. Hypokalemia secondary to above.  3. History of gastric ulcer with perforation status post repair and      probable vagotomy.   PLAN:  The patient is admitted to the service of Dr. Loreta Ave for IV fluid  hydration.  We will replace his potassium, place him on around-the-clock  antiemetics, IV PPI, and will need further psychiatric intervention and  a primary MD for outpatient management.  Dr. Loreta Ave to resume his care on  Monday.      Amy Esterwood, PA-C      Malcolm T. Russella Dar, MD, [facility]  Electronically Signed    AE/MEDQ  D:  10/02/2006  T:  10/03/2006  Job:  [id number]   cc:   Anselmo Rod, M.D.

## 2010-07-21 NOTE — H&P (Signed)
NAME:  NICKLOS, GAXIOLA NO.:  [id number]   MEDICAL RECORD NO.:  [id number]          PATIENT TYPE:  EMS   LOCATION:  MAJO                         FACILITY:  [facility]   PHYSICIAN:  Jordan Hawks. Elnoria Howard, MD    DATE OF BIRTH:  04-30-1965   DATE OF ADMISSION:  09/18/2006  DATE OF DISCHARGE:                              HISTORY & PHYSICAL   REASON FOR ADMISSION:  Nausea and vomiting.   HISTORY OF PRESENT ILLNESS:  This is a 45 year old gentleman with a  chronic history of nausea and vomiting.  Apparently the patient states  that he has had a long history of peptic ulcer disease since the age of  73, and subsequently he had a perforated gastric ulcer in 2007 operated  on by Dr. Luan Pulling.  At that time, per his report, it appears that the  patient may have had a vagotomy.  Unfortunately, he continues to have  persistent symptoms.  The patient states that he was evaluated by Dr.  Loreta Ave approximately one and half to two months ago.  In the past he has  had an endoscopy, and the record shows that he had an EGD performed in  2001, although he states that he had a recent EGD performed  approximately one year ago.  The findings in 2001 were suspicious for  gastroparesis, as there was a large amount of retained food material.  Additionally, because of the patient has no insurance, he has traveled  from hospital to hospital seeking care without any significant  resolution.  The patient states that he is very frustrated, as he  continues to have persistent symptoms.  Treatment medications with PPIs  have been unsuccessful, and he reports having a 30 pound weight loss  over the past two years.  He is unable to provide any other significant  history at this time, as he is actively vomiting, and his wife is unable  to provide any clear history because of her hysteria.   PAST MEDICAL HISTORY:  Significant for depression/anxiety, and as stated  above.   ALLERGIES:  NO KNOWN DRUG  ALLERGIES.   SOCIAL HISTORY:  No alcohol or tobacco use; however he does admit to  using marijuana on an occasional basis.   REVIEW OF SYSTEMS:  As stated above in the history of present illness,  otherwise negative.   PHYSICAL EXAMINATION:  VITAL SIGNS:  Stable.  GENERAL:  The patient is actively vomiting.  HEENT:  Normocephalic, atraumatic.  Extraocular muscles intact.  NECK:  Supple.  No lymphadenopathy.  LUNGS:  Clear to auscultation bilaterally.  CARDIOVASCULAR:  Regular, rate, and rhythm.  ABDOMEN:  Flat, soft, nontender, nondistended.  There is a large midline  incision.  EXTREMITIES:  No clubbing, cyanosis, or edema.  RECTAL:  Negative for any blood or any palpable abnormalities.   LABORATORY DATA:  Laboratory values are pending at this time.   IMPRESSION:  Chronic nausea and vomiting.  The etiology is unknown at  this time.  He continues to have a significant amount of symptoms and  despite treatment, there has been no improvement.  Prior gastric  emptying scans  have been unrevealing; however, the last documented scan  that I am able to obtain was performed in 2001.  I  believe at this time a repeat evaluation with a gastric emptying scan is  warranted.  Because of his long history of nausea and vomiting, I am  uncertain if any true benefit can be obtained during this  hospitalization.  Hopefully, symptomatic control can be achieved.      Jordan Hawks Elnoria Howard, MD  Electronically Signed     PDH/MEDQ  D:  09/18/2006  T:  09/19/2006  Job:  [id number]

## 2010-07-22 ENCOUNTER — Emergency Department (HOSPITAL_BASED_OUTPATIENT_CLINIC_OR_DEPARTMENT_OTHER)
Admission: EM | Admit: 2010-07-22 | Discharge: 2010-07-22 | Disposition: A | Discharge status: Home / Self Care | Payer: Medicare Other | Attending: Emergency Medicine | Admitting: Emergency Medicine

## 2010-07-22 ENCOUNTER — Emergency Department (INDEPENDENT_AMBULATORY_CARE_PROVIDER_SITE_OTHER): Payer: Medicare Other

## 2010-07-22 DIAGNOSIS — R1110 Vomiting, unspecified: Secondary | ICD-10-CM

## 2010-07-22 DIAGNOSIS — R112 Nausea with vomiting, unspecified: Secondary | ICD-10-CM | POA: Insufficient documentation

## 2010-07-22 DIAGNOSIS — K219 Gastro-esophageal reflux disease without esophagitis: Secondary | ICD-10-CM | POA: Insufficient documentation

## 2010-07-22 DIAGNOSIS — F411 Generalized anxiety disorder: Secondary | ICD-10-CM | POA: Insufficient documentation

## 2010-07-22 DIAGNOSIS — R109 Unspecified abdominal pain: Secondary | ICD-10-CM

## 2010-07-22 DIAGNOSIS — B1920 Unspecified viral hepatitis C without hepatic coma: Secondary | ICD-10-CM

## 2010-07-22 LAB — DIFFERENTIAL
Basophils Absolute: 0 10*3/uL (ref 0.0–0.1)
Eosinophils Relative: 0 % (ref 0–5)
Lymphocytes Relative: 13 % (ref 12–46)
Lymphs Abs: 1.2 10*3/uL (ref 0.7–4.0)
Monocytes Absolute: 0.7 10*3/uL (ref 0.1–1.0)
Neutrophils Relative %: 79 % — ABNORMAL HIGH (ref 43–77)

## 2010-07-22 LAB — URINE MICROSCOPIC-ADD ON

## 2010-07-22 LAB — URINALYSIS, ROUTINE W REFLEX MICROSCOPIC
Hgb urine dipstick: NEGATIVE
Ketones, ur: 15 mg/dL — AB
Leukocytes, UA: NEGATIVE
Nitrite: NEGATIVE
Protein, ur: 30 mg/dL — AB
Specific Gravity, Urine: 1.03 (ref 1.005–1.030)
Urobilinogen, UA: 1 mg/dL (ref 0.0–1.0)
pH: 7 (ref 5.0–8.0)

## 2010-07-22 LAB — COMPREHENSIVE METABOLIC PANEL
Albumin: 4.6 g/dL (ref 3.5–5.2)
BUN: 12 mg/dL (ref 6–23)
Calcium: 10 mg/dL (ref 8.4–10.5)
Creatinine, Ser: 0.8 mg/dL (ref 0.4–1.5)
Total Protein: 7.7 g/dL (ref 6.0–8.3)

## 2010-07-22 LAB — CBC
MCH: 31.3 pg (ref 26.0–34.0)
RBC: 4.92 MIL/uL (ref 4.22–5.81)

## 2010-07-22 LAB — LIPASE, BLOOD: Lipase: 15 U/L (ref 11–59)

## 2010-07-24 NOTE — Procedures (Signed)
[facility]  Patient:    Jordan Herrera, Jordan Herrera                  MRN: [id number] Proc. Date: 08/19/99 Adm. Date:  [id number] Disc. Date: [id number] Attending:  Charna Elizabeth CC:         Ronnald Nian, M.D.                           Procedure Report  DATE OF BIRTH:  [birth year].  REFERRING PHYSICIAN:  Ronnald Nian, M.D.  PROCEDURE PERFORMED:  Esophagogastroduodenoscopy.  ENDOSCOPIST:  Anselmo Rod, M.D.  INSTRUMENT USED:  Olympus video panendoscope.  INDICATIONS FOR PROCEDURE:  The patient is a 45 year old white male with a history of nausea and vomiting, epigastric pain and recent abnormality on an upper GI series with deformity in the duodenal bulb, rule out peptic ulcer disease.  PREPROCEDURE PREPARATION:  Informed consent was procured from the patient. The patient was fasted for eight hours prior to the procedure.  PREPROCEDURE PHYSICAL:  The patient had stable vital signs.  Neck supple. Chest clear to auscultation.  S1, S2 regular.  Abdomen soft with normal abdominal bowel sounds.  DESCRIPTION OF PROCEDURE:  The patient was placed in left lateral decubitus position and sedated with 100 mg of Demerol and 10 mg of Versed intravenously. Once the patient was adequately sedated and maintained on low-flow oxygen and continuous cardiac monitoring, the Olympus video panendoscope was advanced through the mouthpiece, over the tongue, into the esophagus under direct vision.  The entire esophagus appeared normal without evidence of ring, stricture, masses, lesions or esophagitis.  On further advancement of the scope into the stomach there was a large amount of debris in the stomach and visualization of the gastric mucosa was not adequate.  There was a duodenal bulb deformity from possible previous peptic ulcer disease but no active ulceration was seen.  The proximal small bowel up to 60 cm appeared normal.  IMPRESSION: 1. Large amount of  debris in stomach, question gastroparesis.  There was    active reflux of gastric debris into the esophagus during the procedure. 2. The gastric mucosa not visualized because of debris. 3. Normal-appearing esophagus. 4. Deformed duodenal bulb possibly from previous ulcer disease. 5. Normal small bowel distal to the bulb.  RECOMMENDATION: 1. Gastric emptying study to be done if not already done.  We will check    the patients records and make sure this has not been done in the recent    past. 2. Check hemoglobin A1C today along with CBC and comprehensive metabolic    panel. 3. Reglan to be used 10 mg half hour before breakfast, lunch and dinner,    #90 prescribed. 4. Outpatient follow-up in the next two weeks. DD:  08/19/99 TD:  08/24/99 Job: [id number]

## 2010-08-04 ENCOUNTER — Encounter: Payer: Self-pay | Admitting: Family Medicine

## 2010-08-05 ENCOUNTER — Encounter: Payer: Self-pay | Admitting: Family Medicine

## 2010-08-05 ENCOUNTER — Ambulatory Visit (INDEPENDENT_AMBULATORY_CARE_PROVIDER_SITE_OTHER): Payer: Medicare Other | Admitting: Family Medicine

## 2010-08-05 VITALS — BP 132/87 | HR 99 | Ht 72.0 in | Wt 183.0 lb

## 2010-08-05 DIAGNOSIS — B079 Viral wart, unspecified: Secondary | ICD-10-CM

## 2010-08-05 NOTE — Progress Notes (Signed)
  Subjective:    Patient ID: Jordan Herrera, male    DOB: 12-28-1965, 45 y.o.   MRN: [id number]  HPI 45 yo WM presents for a recurring 'rash' on his hands for about a year.  No pain or itching.  He has picked some of the lesions off but new ones have devleoped.  More recently, he has noticed bumps on his penis.  Denies new sexual partners.  Tried compound W on his hand but it did not work.    BP 132/87  Pulse 99  Ht 6' (1.829 m)  Wt 183 lb (83.008 kg)  BMI 24.82 kg/m2  SpO2 96%     Review of Systems as per HPI     Objective:   Physical Exam    Skin: warty raised fleshy colored lesions on dorsum of L hand located on the tattoo that is present.  Warty lesions also noted on the underside of penis.       Assessment & Plan:  Warts:  Cleaned off hand warts with alcohol swab and treated 6 individual warts with liquid nitrogen, 2 freeze cycles.  Pt tolerated this well.  Given wound care instructions.  Call if they have not resolved in 10 days.  Derm referral if indicated.  Consider freezing penile warts.  Did discuss w/ pt that warts are viral, contagious and can come back.  He is not sexually active at this time.

## 2010-08-12 ENCOUNTER — Ambulatory Visit (INDEPENDENT_AMBULATORY_CARE_PROVIDER_SITE_OTHER): Payer: Medicare Other | Admitting: Family Medicine

## 2010-08-12 ENCOUNTER — Encounter: Payer: Self-pay | Admitting: Family Medicine

## 2010-08-12 DIAGNOSIS — B078 Other viral warts: Secondary | ICD-10-CM

## 2010-08-12 DIAGNOSIS — A630 Anogenital (venereal) warts: Secondary | ICD-10-CM | POA: Insufficient documentation

## 2010-08-12 NOTE — Progress Notes (Signed)
  Subjective:    Patient ID: Jordan Herrera, male    DOB: [birth year], 45 y.o.   MRN: [id number]  HPI44 yo WM presents for cryo genital warts.  He had cryotherapy for warts on hand last wk and they are healing well.  He has 2 more on the L hand and 3 on the L side of his penis.  He is not currently in any relationships.  BP 134/90  Pulse 75  Wt 185 lb (83.915 kg)  SpO2 100%   Review of Systems     Objective:   Physical Exam        Assessment & Plan:  WARTS:  2 remaining warts on dorsum L hand treated with 2 freeze cycles of liquid nitrogen after cleaning w/ alcohol swab and 3 left/ under shaft genital warts cleaned with alcohol and treated with 2 freeze cycles of liquid nitrogen.  Pt tolerated procedure well.  Wound care instructions given. Call if any problems.

## 2010-08-12 NOTE — Patient Instructions (Signed)
Keep areas of cryo clean with soap and water daily. Use topical neosporin each day.  Call if any problem.

## 2010-08-24 ENCOUNTER — Other Ambulatory Visit: Payer: Self-pay | Admitting: Family Medicine

## 2010-09-16 ENCOUNTER — Encounter (INDEPENDENT_AMBULATORY_CARE_PROVIDER_SITE_OTHER): Payer: Medicare Other | Admitting: Licensed Clinical Social Worker

## 2010-09-16 DIAGNOSIS — F39 Unspecified mood [affective] disorder: Secondary | ICD-10-CM

## 2010-10-10 IMAGING — CR DG ABDOMEN ACUTE W/ 1V CHEST
3 series · 3 of 3 positions shown · non-contrast
Comparison: 10/01/2006

CLINICAL DATA: 44-year-old male with nausea, vomiting

ACUTE ABDOMEN SERIES (ABDOMEN 2 VIEW & CHEST 1 VIEW)

[w chest pa]
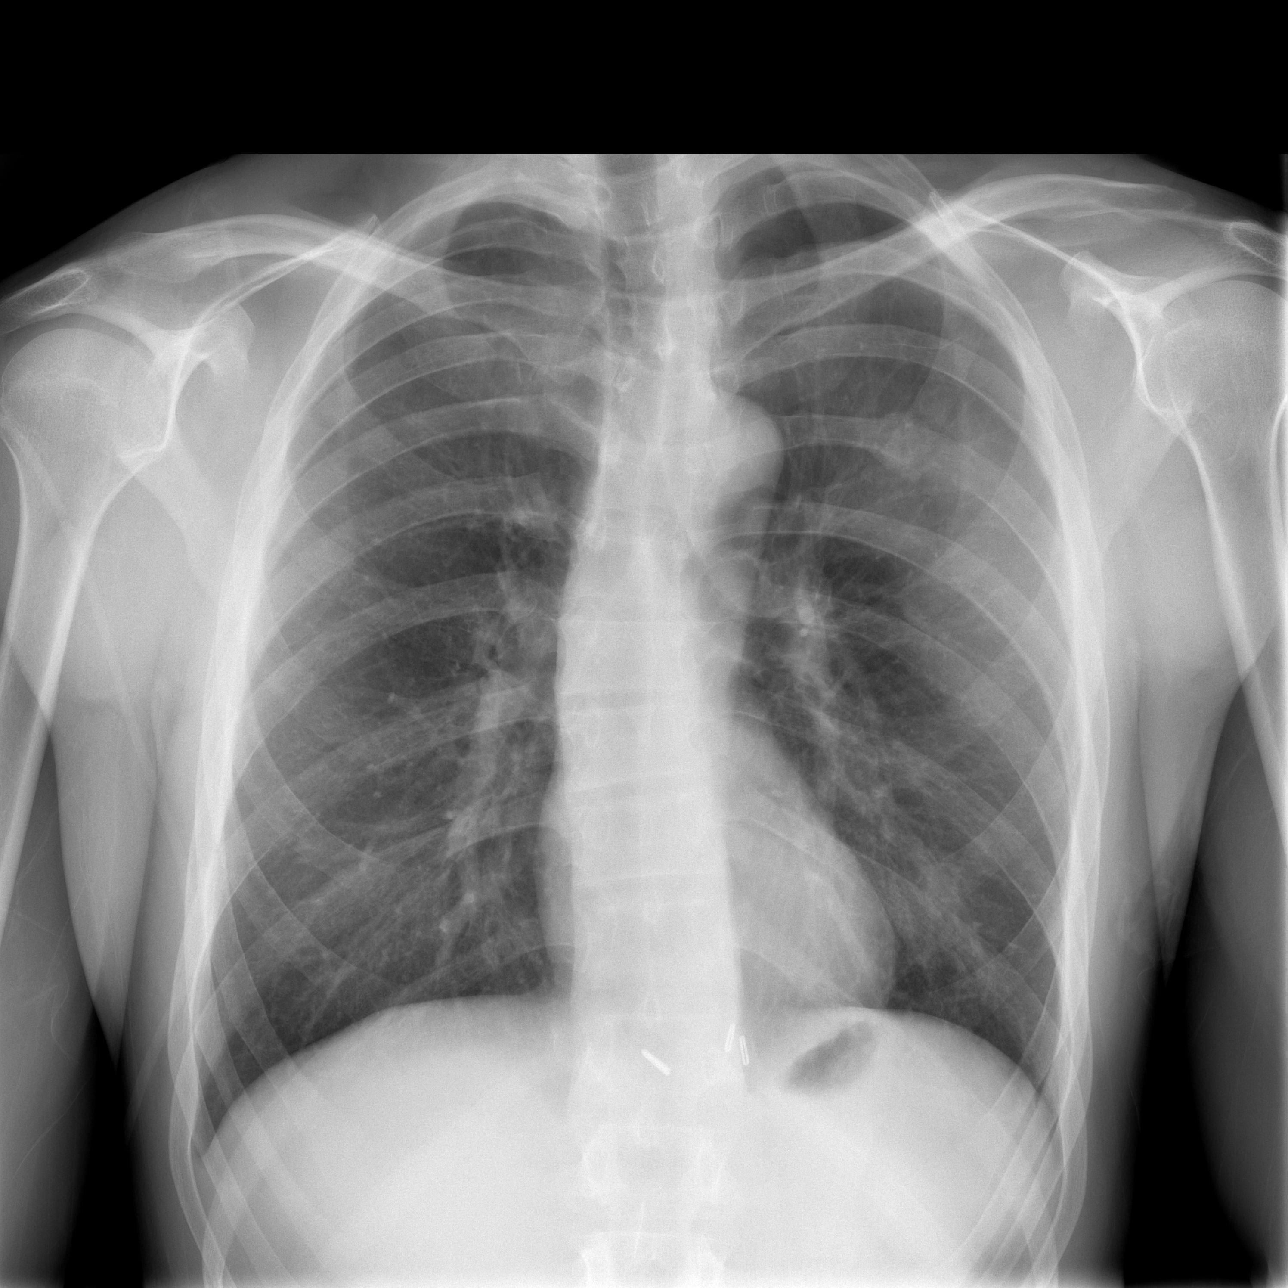

[w abdomen upright]
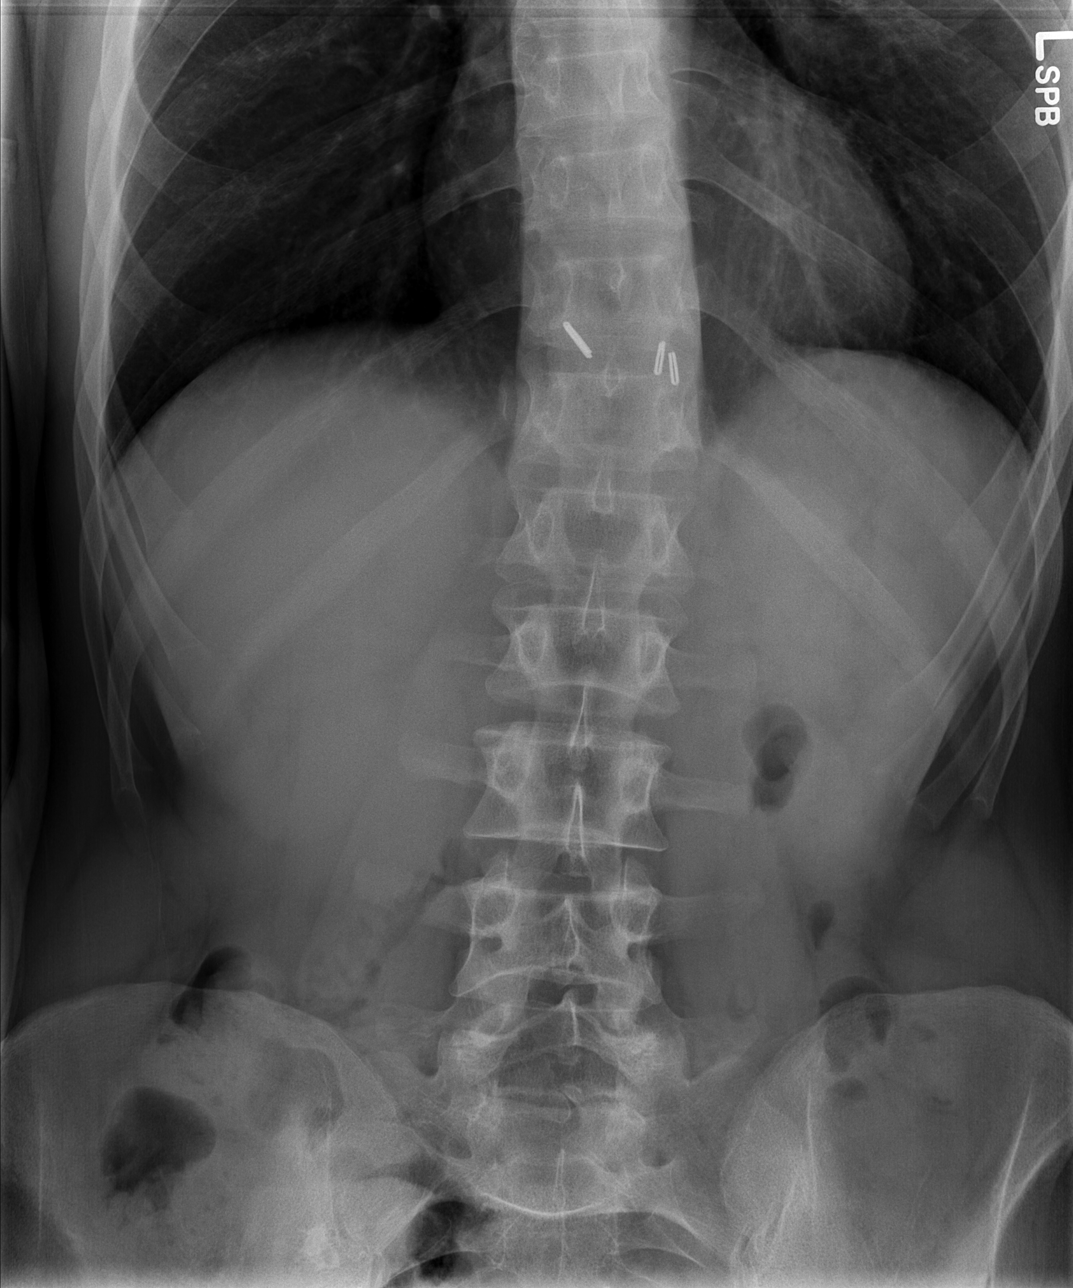

[t abdomen supine]
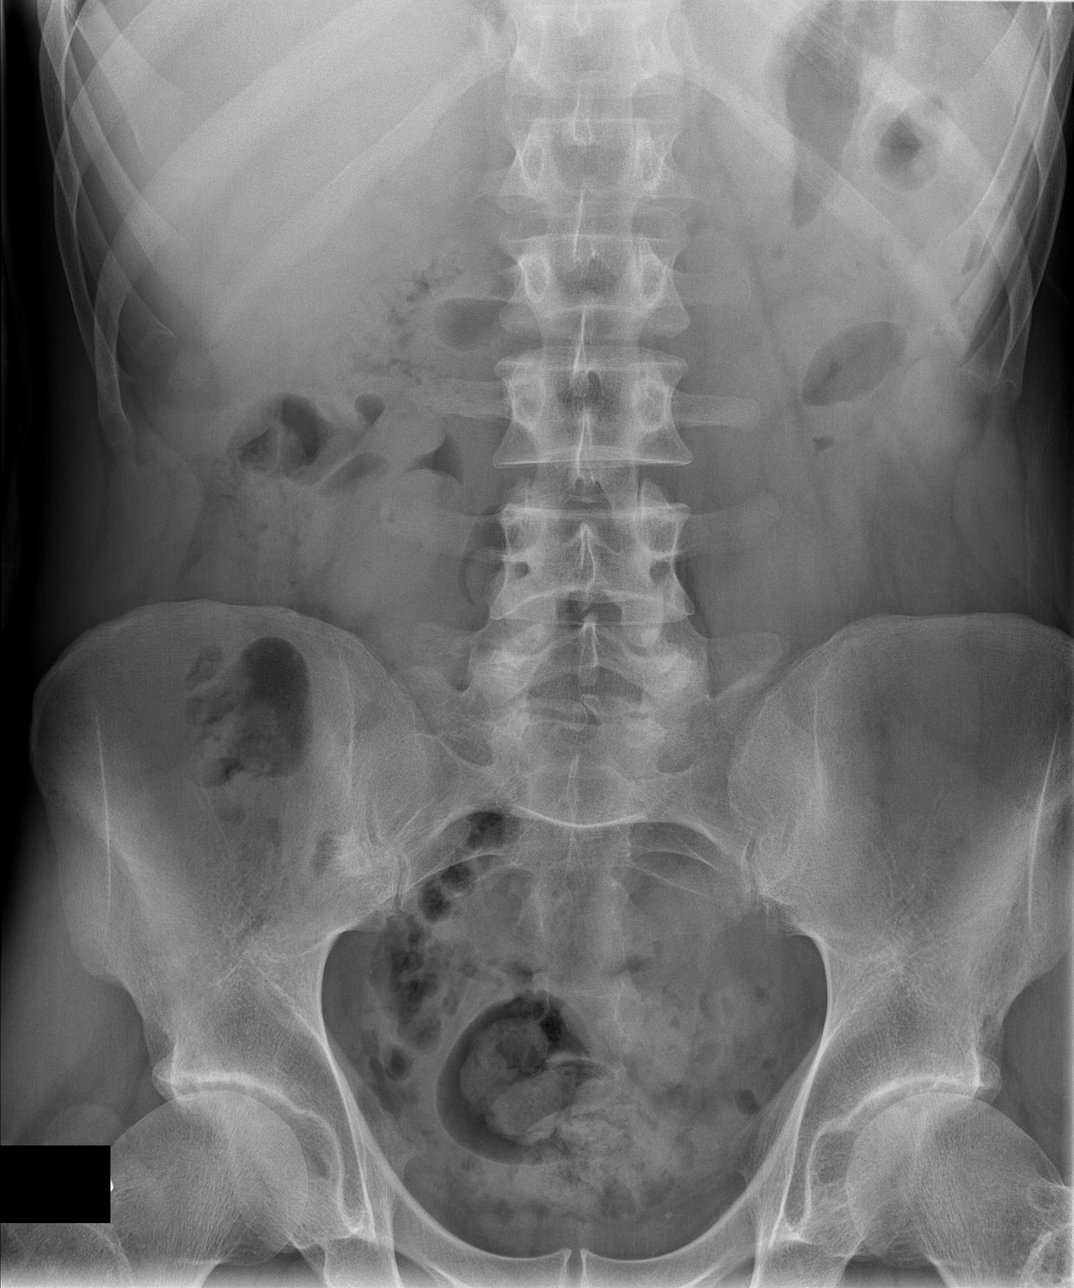

[3 of 3 positions shown; findings below may reference images not displayed]

FINDINGS: The lungs are clear.  The heart is normal in size.  There
is a healed posterior left fifth rib fracture seen.  Surgical clips
are seen at the gastroesophageal junction.  There is no evidence of
small bowel obstruction.  Specifically, there are no dilated loops
of small bowel or air-fluid levels seen on the upright exam.  There
is no intra-abdominal free air present.  No unexpected
calcifications are seen.  The bones are within normal limits.  Note
is made of nonfusion of the posterior elements at L5, a normal
anatomic variant.
IMPRESSION: 1.  No acute cardiopulmonary disease.
2.  Nonspecific, nonobstructive bowel gas pattern.

## 2010-10-13 ENCOUNTER — Ambulatory Visit (INDEPENDENT_AMBULATORY_CARE_PROVIDER_SITE_OTHER): Payer: Medicare Other | Admitting: Family Medicine

## 2010-10-13 ENCOUNTER — Encounter: Payer: Self-pay | Admitting: Family Medicine

## 2010-10-13 ENCOUNTER — Encounter (INDEPENDENT_AMBULATORY_CARE_PROVIDER_SITE_OTHER): Payer: Medicare Other | Admitting: Psychiatry

## 2010-10-13 VITALS — BP 138/85 | HR 82 | Temp 98.0°F | Ht 71.0 in | Wt 179.0 lb

## 2010-10-13 DIAGNOSIS — Z0000 Encounter for general adult medical examination without abnormal findings: Secondary | ICD-10-CM

## 2010-10-13 DIAGNOSIS — F319 Bipolar disorder, unspecified: Secondary | ICD-10-CM

## 2010-10-13 NOTE — Progress Notes (Signed)
  Subjective:    Patient ID: Jordan Herrera, male    DOB: 1966-01-07, 45 y.o.   MRN: [id number]  HPI  45 yo WM presents for CPE.  He had labs done in May of this year.  Doing well on meds.  He is seeing Dr Christell Constant for psychiatric care, stable on meds.   Tdap done in 09.  He is adopted.  Unsure of fam hx.  Denies CP, SOB or blood in stool.  Seeing GI/ hepatologist for severe GERD and Hep C.  Had a liver biopsy last year.  BP 138/85  Pulse 82  Temp(Src) 98 F (36.7 C) (Oral)  Ht 5\' 11"  (1.803 m)  Wt 179 lb (81.194 kg)  BMI 24.97 kg/m2  SpO2 98%   Review of Systems Gen: no fevers, chills, hot flashes, night sweats, change in weight GI: no N/V/C/D GU: no dysuria, incontinence or sexual dysfunction CV: no chest pain, DOE, palpitations s or edema Pulm:  Denies CP, SOB or chronic cough      Objective:   Physical Exam  Gen: alert, well groomed in NAD Neck: no thyromegaly or cervical lymphadenopathy CV: RRR w/o murmur, no audible carotid bruits or abdominal aortic bruits Ext: no edema, clubbing or cyanosis Lungs: CTA bilat w/o W/R/R; nonlabored HEENT:  [place]/AT; PERRLA; oropharynx pink and moist with good dentition Abd: soft, NT, ND, NABS, No HSM, no audible AA bruits Skin: warm and dry; no rash, pallor or jaundice Psych: does not appear anxious or depressed; answers questions appropriately       Assessment & Plan:  Assesment:  1. CPE- Keeping healthy checklist for men reviewed today.  BP at goal.  BMI 24.9  in the normal range.     Labs done in May. Colonoscopy/ EGD done by GI. Immunizations UTd.  Encouraged tobacco cessation. Encouraged healthy diet, regular exercise, MVI daily. Return for next physical in 1 yr.

## 2010-10-13 NOTE — Patient Instructions (Signed)
Keep up the good work.  Labs done May 2012.  Return for f/u in 6 mos, sooner if neeed.

## 2010-10-19 ENCOUNTER — Other Ambulatory Visit: Payer: Self-pay | Admitting: *Deleted

## 2010-10-19 DIAGNOSIS — K219 Gastro-esophageal reflux disease without esophagitis: Secondary | ICD-10-CM

## 2010-10-19 MED ORDER — CLOBETASOL PROP EMOLLIENT BASE 0.05 % EX CREA: TOPICAL_CREAM | CUTANEOUS | Status: DC

## 2010-10-19 MED ORDER — ESOMEPRAZOLE MAGNESIUM 40 MG PO CPDR: 40.0000 mg | DELAYED_RELEASE_CAPSULE | Freq: Two times a day (BID) | ORAL | Status: DC

## 2010-11-17 ENCOUNTER — Encounter (HOSPITAL_COMMUNITY): Payer: Medicare Other | Admitting: Licensed Clinical Social Worker

## 2010-11-27 LAB — CBC
MCV: 93.6
Platelets: 236
RDW: 12.1
WBC: 11.9 — ABNORMAL HIGH

## 2010-11-27 LAB — DIFFERENTIAL
Basophils Absolute: 0
Eosinophils Relative: 0
Lymphocytes Relative: 8 — ABNORMAL LOW
Monocytes Absolute: 0.7
Monocytes Relative: 6
Neutro Abs: 10.2 — ABNORMAL HIGH

## 2010-11-27 LAB — URINALYSIS, ROUTINE W REFLEX MICROSCOPIC
Glucose, UA: NEGATIVE
Leukocytes, UA: NEGATIVE
Nitrite: NEGATIVE
pH: 6.5

## 2010-11-27 LAB — COMPREHENSIVE METABOLIC PANEL
AST: 30
Albumin: 4.6
BUN: 10
Chloride: 106
Creatinine, Ser: 0.99
GFR calc Af Amer: >60
Potassium: 4.3
Total Bilirubin: 0.9
Total Protein: 8.1

## 2010-11-27 LAB — URINE MICROSCOPIC-ADD ON

## 2010-12-11 ENCOUNTER — Other Ambulatory Visit: Payer: Self-pay | Admitting: Family Medicine

## 2010-12-11 ENCOUNTER — Telehealth: Payer: Self-pay | Admitting: Family Medicine

## 2010-12-11 MED ORDER — CYCLOBENZAPRINE HCL 10 MG PO TABS: 10.0000 mg | ORAL_TABLET | Freq: Every evening | ORAL | Status: DC | PRN

## 2010-12-11 NOTE — Telephone Encounter (Signed)
Pharmacy called for the pt requesting his cyclobenzaprine medication. PLan:  Reviewed chart file and pt not technically due til Monday 12-21-10.  Told pharmacist to have pt call on 12-21-10 for request. Jarvis Newcomer, LPN [facility]

## 2010-12-11 NOTE — Telephone Encounter (Signed)
Patient called req that [facility] pharmacy unable to get scripts and req to speak with a nurse about meds

## 2010-12-16 ENCOUNTER — Encounter (INDEPENDENT_AMBULATORY_CARE_PROVIDER_SITE_OTHER): Payer: Medicare Other | Admitting: Licensed Clinical Social Worker

## 2010-12-16 ENCOUNTER — Ambulatory Visit (INDEPENDENT_AMBULATORY_CARE_PROVIDER_SITE_OTHER): Payer: Medicare Other | Admitting: Family Medicine

## 2010-12-16 DIAGNOSIS — F39 Unspecified mood [affective] disorder: Secondary | ICD-10-CM

## 2010-12-16 DIAGNOSIS — Z23 Encounter for immunization: Secondary | ICD-10-CM

## 2010-12-16 NOTE — Progress Notes (Signed)
Here for flu vaccine

## 2010-12-21 LAB — BASIC METABOLIC PANEL
CO2: 27
Chloride: 104
GFR calc non Af Amer: >60
Glucose, Bld: 121 — ABNORMAL HIGH
Glucose, Bld: 87
Potassium: 3.5
Potassium: 4
Sodium: 137
Sodium: 139

## 2010-12-21 LAB — CBC
HCT: 42.6
HCT: 46.8
Hemoglobin: 14.4
Hemoglobin: 16
MCHC: 34.2
MCV: 94.3
MCV: 95
RBC: 4.96
WBC: 10.6 — ABNORMAL HIGH
WBC: 13.9 — ABNORMAL HIGH

## 2010-12-21 LAB — LIPASE, BLOOD: Lipase: 16

## 2010-12-21 LAB — HEPATIC FUNCTION PANEL
Albumin: 4.6
Alkaline Phosphatase: 67
Bilirubin, Direct: 0.1
Total Bilirubin: 1.2

## 2010-12-21 LAB — DIFFERENTIAL
Basophils Relative: 1
Eosinophils Absolute: 0
Eosinophils Relative: 0
Lymphs Abs: 1.1
Monocytes Absolute: 1 — ABNORMAL HIGH
Monocytes Relative: 7
Neutrophils Relative %: 85 — ABNORMAL HIGH

## 2010-12-21 LAB — I-STAT 8, (EC8 V) (CONVERTED LAB)
BUN: 12
Bicarbonate: 31.3 — ABNORMAL HIGH
Glucose, Bld: 144 — ABNORMAL HIGH
Hemoglobin: 17.7 — ABNORMAL HIGH
Sodium: 136
pH, Ven: 7.518 — ABNORMAL HIGH

## 2010-12-21 LAB — POCT I-STAT CREATININE: Operator id: 196461

## 2010-12-22 LAB — COMPREHENSIVE METABOLIC PANEL
ALT: 13
Albumin: 4
Alkaline Phosphatase: 67
Potassium: 4
Sodium: 139
Total Protein: 7.1

## 2010-12-22 LAB — CBC
HCT: 45
Hemoglobin: 15.1
MCHC: 33.7
MCHC: 34
MCV: 93.9
MCV: 94.5
MCV: 95.4
Platelets: 169
Platelets: 174
RBC: 4.16 — ABNORMAL LOW
RDW: 11.8
WBC: 10.7 — ABNORMAL HIGH

## 2011-01-11 ENCOUNTER — Other Ambulatory Visit: Payer: Self-pay | Admitting: *Deleted

## 2011-01-11 MED ORDER — CYCLOBENZAPRINE HCL 10 MG PO TABS: 10.0000 mg | ORAL_TABLET | Freq: Every evening | ORAL | Status: DC | PRN

## 2011-01-12 ENCOUNTER — Encounter (HOSPITAL_COMMUNITY): Payer: Self-pay | Admitting: Psychiatry

## 2011-01-13 ENCOUNTER — Encounter (HOSPITAL_COMMUNITY): Payer: Self-pay | Admitting: Psychiatry

## 2011-01-13 ENCOUNTER — Ambulatory Visit (HOSPITAL_COMMUNITY): Payer: Medicare Other | Admitting: Psychiatry

## 2011-01-13 VITALS — BP 120/75 | Ht 71.5 in | Wt 183.0 lb

## 2011-01-13 DIAGNOSIS — F411 Generalized anxiety disorder: Secondary | ICD-10-CM

## 2011-01-13 NOTE — Progress Notes (Signed)
   [facility] Follow-up Outpatient Visit  Donney Caraveo [place] 30-Nov-1965  Date: 01/13/11   Subjective:  The patient is a 45 year old male who has been seen here by myself at [facility] since January 2010. he is currently diagnosed with generalized anxiety disorder depression and a cyclic vomiting disorder. 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 He is still not working. He feels like his vomiting as better under control. He feels like the Wellbutrin XL has helped her more than any other medication. He reports good sleep and appetite. His weight is up 3 pounds today. He does like his anxiety is controlled.  Filed Vitals:   01/13/11 1006  BP: 120/75    Mental Status Examination  Appearance: Somewhat disheveled but normal state for patient  Alert: Yes Attention: good  Cooperative: Yes Eye Contact: Good Speech: Regular rate rhythm and volume Psychomotor Activity: Normal Memory/Concentration: Intact to recent and remote Oriented: person, place, time/date and situation Mood: Euphoric Affect: Full Range Thought Processes and Associations: Logical Fund of Knowledge: Fair Thought Content: Patient denies any suicidal or homicidal thoughts Insight: Fair Judgement: Fair  Diagnosis: Generalized anxiety disorder and depression and cyclic vomiting  Treatment Plan: At this point we will not change his medications. We will continue him on the Seroquel, Wellbutrin XL, and Xanax. We will get him back to see me in 3 months. Patient can call with any concerns.  Jamse Mead, MD

## 2011-01-13 NOTE — Patient Instructions (Addendum)
Call with concerns

## 2011-02-05 ENCOUNTER — Telehealth: Payer: Self-pay | Admitting: *Deleted

## 2011-02-05 DIAGNOSIS — K219 Gastro-esophageal reflux disease without esophagitis: Secondary | ICD-10-CM

## 2011-02-05 MED ORDER — CLOBETASOL PROP EMOLLIENT BASE 0.05 % EX CREA: TOPICAL_CREAM | CUTANEOUS | Status: DC

## 2011-02-05 MED ORDER — TIZANIDINE HCL 4 MG PO CAPS: 4.0000 mg | ORAL_CAPSULE | Freq: Every evening | ORAL | Status: DC | PRN

## 2011-02-05 MED ORDER — ESOMEPRAZOLE MAGNESIUM 40 MG PO CPDR: 40.0000 mg | DELAYED_RELEASE_CAPSULE | Freq: Two times a day (BID) | ORAL | Status: DC

## 2011-02-05 NOTE — Telephone Encounter (Signed)
Pt calls and states that his insurance is no longer going to pay for his Flexeril. They will pay for Tizanidine HCL. Can this be sent to his pharmacy. Pt has appt with you on Feb. 7th

## 2011-02-05 NOTE — Telephone Encounter (Signed)
rx sent. Can you call and cancer the refills on his flexeril.

## 2011-02-09 ENCOUNTER — Encounter (HOSPITAL_COMMUNITY): Payer: Medicare Other | Admitting: Licensed Clinical Social Worker

## 2011-02-10 ENCOUNTER — Encounter (HOSPITAL_COMMUNITY): Payer: Self-pay | Admitting: Licensed Clinical Social Worker

## 2011-02-10 ENCOUNTER — Ambulatory Visit (INDEPENDENT_AMBULATORY_CARE_PROVIDER_SITE_OTHER): Payer: Medicare Other | Admitting: Licensed Clinical Social Worker

## 2011-02-10 DIAGNOSIS — F39 Unspecified mood [affective] disorder: Secondary | ICD-10-CM

## 2011-02-10 NOTE — Progress Notes (Signed)
   THERAPIST PROGRESS NOTE  Session Time: 10:05 - 10:55  AM  Participation Level: Active  Behavioral Response: Well GroomedAlertEuthymic  Type of Therapy: Individual Therapy  Treatment Goals addressed: Anger and Communication: how communicates when angry  Interventions: Solution Focused, Strength-based, Supportive and Anger Management Training  Summary: Jordan Herrera is a 45 y.o. male who presents with a good mood today.  Continues to have problems with daily vomiting.  He gets stressed easily and vomiting is from stress.  He lets too much upset him.  He has had some conflict with a new family who has a son who plays sports - his father seems to cause problems and [name] gets upset about some situations.  He seems to be back on track  - he is proud of his son who has a pretty mature attitude about things.  He is riding dirt bikes - they do this together - he hopes to be able to get him a 150 bike for Christmas.  He and his mother work well together about their son.  He is in the same apartment and working out his financial situation ok - he talks fondly of [name] and the people he knows for he was raised there.  He has worked it out so that he can get his driver's license back. He is pleased about that.  Still just feels the need to come in every couple of months to make sure he is on track.  So far so good..   Suicidal/Homicidal: Nowithout intent/plan  Plan: Return again in 8 weeks.  Diagnosis: Axis I: Mood Disorder NOS    Axis II: Deferred    Safiatou Islam,JUDITH A, LCSW 02/10/2011

## 2011-02-10 NOTE — Patient Instructions (Signed)
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 Good job staying clean

## 2011-03-08 ENCOUNTER — Other Ambulatory Visit (HOSPITAL_COMMUNITY): Payer: Self-pay | Admitting: Psychiatry

## 2011-03-08 ENCOUNTER — Telehealth (HOSPITAL_COMMUNITY): Payer: Self-pay

## 2011-03-08 DIAGNOSIS — F411 Generalized anxiety disorder: Secondary | ICD-10-CM

## 2011-03-08 MED ORDER — ALPRAZOLAM 1 MG PO TABS: 1.0000 mg | ORAL_TABLET | Freq: Every day | ORAL | Status: DC | PRN

## 2011-03-10 MED ORDER — ALPRAZOLAM 1 MG PO TABS: 1.0000 mg | ORAL_TABLET | Freq: Two times a day (BID) | ORAL | Status: DC

## 2011-03-10 NOTE — Telephone Encounter (Signed)
Completed.

## 2011-04-03 ENCOUNTER — Other Ambulatory Visit: Payer: Self-pay | Admitting: Family Medicine

## 2011-04-06 ENCOUNTER — Other Ambulatory Visit (HOSPITAL_COMMUNITY): Payer: Self-pay | Admitting: Psychiatry

## 2011-04-06 MED ORDER — BUPROPION HCL ER (XL) 300 MG PO TB24: 300.0000 mg | ORAL_TABLET | Freq: Every day | ORAL | Status: DC

## 2011-04-13 ENCOUNTER — Ambulatory Visit (HOSPITAL_COMMUNITY): Payer: Self-pay | Admitting: Licensed Clinical Social Worker

## 2011-04-15 ENCOUNTER — Ambulatory Visit: Payer: Medicare Other | Admitting: Family Medicine

## 2011-04-15 ENCOUNTER — Ambulatory Visit (INDEPENDENT_AMBULATORY_CARE_PROVIDER_SITE_OTHER): Payer: Medicare Other | Admitting: Psychiatry

## 2011-04-15 ENCOUNTER — Encounter (HOSPITAL_COMMUNITY): Payer: Self-pay | Admitting: Psychiatry

## 2011-04-15 VITALS — BP 142/95 | Ht 71.5 in | Wt 188.0 lb

## 2011-04-15 DIAGNOSIS — F411 Generalized anxiety disorder: Secondary | ICD-10-CM

## 2011-04-15 NOTE — Progress Notes (Signed)
   [facility] Follow-up Outpatient Visit  Jordan Herrera Kenneth 1965/06/29  Date: 01/13/11   Subjective:  The patient is a 46 year old male who has been seen here by myself at [facility] since January 2010.  He is currently diagnosed with generalized anxiety disorder,  depression and a cyclic vomiting disorder. 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 He is still not working. He feels like his vomiting as better under control. He feels like the Wellbutrin XL has helped her more than any other medication. He reports good sleep and appetite. His weight is up 3 pounds today. He does like his anxiety is controlled. The patient has heard from the [facility], and has a hearing on March 14 to find out what he needs to do to complete his reestablishment of a driver's license. He feels that his life is on an upswing.  Filed Vitals:   04/15/11 1100  BP: 142/95    Mental Status Examination  Appearance: Somewhat disheveled but normal state for patient  Alert: Yes Attention: good  Cooperative: Yes Eye Contact: Good Speech: Regular rate rhythm and volume Psychomotor Activity: Normal Memory/Concentration: Intact to recent and remote Oriented: person, place, time/date and situation Mood: Euphoric Affect: Full Range Thought Processes and Associations: Logical Fund of Knowledge: Fair Thought Content: Patient denies any suicidal or homicidal thoughts Insight: Fair Judgement: Fair  Diagnosis: Generalized anxiety disorder, depression and cyclic vomiting  Treatment Plan: At this point we will not change his medications. We will continue him on the Seroquel, Wellbutrin XL, and Xanax. We will get him back to see me in 3 months. Patient can call with any concerns.  Jamse Mead, MD

## 2011-04-16 ENCOUNTER — Ambulatory Visit (INDEPENDENT_AMBULATORY_CARE_PROVIDER_SITE_OTHER): Payer: Medicare Other | Admitting: Family Medicine

## 2011-04-16 ENCOUNTER — Ambulatory Visit (HOSPITAL_COMMUNITY): Payer: Medicare Other | Admitting: Psychiatry

## 2011-04-16 ENCOUNTER — Encounter: Payer: Self-pay | Admitting: Family Medicine

## 2011-04-16 VITALS — BP 140/96 | HR 93 | Wt 189.0 lb

## 2011-04-16 DIAGNOSIS — A630 Anogenital (venereal) warts: Secondary | ICD-10-CM

## 2011-04-16 DIAGNOSIS — K219 Gastro-esophageal reflux disease without esophagitis: Secondary | ICD-10-CM

## 2011-04-16 DIAGNOSIS — R030 Elevated blood-pressure reading, without diagnosis of hypertension: Secondary | ICD-10-CM

## 2011-04-16 NOTE — Progress Notes (Signed)
Addended by: Nani Gasser D on: 04/16/2011 12:43 PM   Modules accepted: Level of Service

## 2011-04-16 NOTE — Patient Instructions (Signed)

## 2011-04-16 NOTE — Progress Notes (Signed)
  Subjective:    Patient ID: Jordan Herrera, male    DOB: [birth year], 46 y.o.   MRN: [id number]  HPI Elevated blood pressure- Here to follow up.  No CP or SOB. Doesn't want to take meds. Admits he has been very sedentary lately.  Says doesn't eat a lot of salt.    GERD ;- Still taking Nexium but still drinking caffeine, soda, greasy and spicey foods.  Overall his symptoms are better controlled but still has frequent flares. In fact some days he feels so nauseated and uncomfortable that he will not eat the entire day. History of peptic ulcer disease.  Needs some warts frozen. He has had this done before and says he has some new lesions.  Hepatiis  C - Seen at the clinic in [place].  F/u in fEb.  Review of Systems     Objective:   Physical Exam  Constitutional: He is oriented to person, place, and time. He appears well-developed and well-nourished.  HENT:  Head: Normocephalic and atraumatic.  Cardiovascular: Normal rate, regular rhythm and normal heart sounds.   Pulmonary/Chest: Effort normal and breath sounds normal.  Neurological: He is alert and oriented to person, place, and time.  Skin: Skin is warm and dry.  Psychiatric: He has a normal mood and affect. His behavior is normal.   He has a cluster of approximately 5-6 genital warts on the dorsum of the penis at the base. They range in size from 0.2 to 0.5 centimeter.       Assessment & Plan:  Elevated blood pressure- Exercise for 30 min 5 days per week.  Low sat diet.  Given H.O. Recheck in 1 months.   GERD- continue nexium, but really stressesd the needed dietary changes.  Habits he continues to keep drink like he has that he is not going to allow her stomach to heal. This is very important especially with his history of peptic ulcer disease.  Genital warts - crythreapy performed. Patient tolerated cryotherapy well. Followup in one to 2 weeks if lesions are not resolving for repeat cryotherapy.  Cryotherapy Procedure  Note  Pre-operative Diagnosis: genital wart  Post-operative Diagnosis: genital wart  Locations: dorum penis at the base.   Indications: discomfort/treatment  Anesthesia: not required   Procedure Details  History of allergy to iodine: no.  Patient informed of risks (permanent scarring, infection, light or dark discoloration, bleeding, infection, weakness, numbness and recurrence of the lesion) and benefits of the procedure and verbal informed consent obtained.  The areas are treated with liquid nitrogen therapy, frozen until ice ball extended 0.2 mm beyond lesion, allowed to thaw, and treated again x 3. The patient tolerated procedure well.  The patient was instructed on post-op care, warned that there may be blister formation, redness and pain. Recommend OTC analgesia as needed for pain.  Condition: Stable  Complications: none.  Plan: 1. Instructed to keep the area dry and covered for 24-48h and clean thereafter. 2. Warning signs of infection were reviewed.   3. Recommended that the patient use OTC acetaminophen as needed for pain.  4. Return in prn or 1-2 weeks if not resolving.

## 2011-04-20 ENCOUNTER — Encounter: Payer: Self-pay | Admitting: Physician Assistant

## 2011-04-20 ENCOUNTER — Ambulatory Visit (INDEPENDENT_AMBULATORY_CARE_PROVIDER_SITE_OTHER): Payer: Medicare Other | Admitting: Physician Assistant

## 2011-04-20 VITALS — BP 148/96 | HR 95 | Wt 181.0 lb

## 2011-04-20 DIAGNOSIS — A630 Anogenital (venereal) warts: Secondary | ICD-10-CM

## 2011-04-20 DIAGNOSIS — R030 Elevated blood-pressure reading, without diagnosis of hypertension: Secondary | ICD-10-CM

## 2011-04-20 NOTE — Progress Notes (Signed)
  Subjective:    Patient ID: Jordan Herrera, male    DOB: [birth year], 46 y.o.   MRN: [id number]  HPI Patient comes in today to have warts treated again. His blood pressure is elevated today. He has started back working out last week and is trying to watch salt intake. 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 This should resolve in the next week or so and he thinks he will not be as anxious.     Review of Systems     Objective:   Physical Exam  Constitutional: He is oriented to person, place, and time. He appears well-developed and well-nourished.  Cardiovascular: Normal rate, regular rhythm and normal heart sounds.   Genitourinary:       Cluster of genital warts at the dorsum penis at the base.   Neurological: He is alert and oriented to person, place, and time.  Psychiatric: He has a normal mood and affect. His behavior is normal.          Assessment & Plan:  Elevated blood pressure- BP remains elevated today. Patient just started exercising last week and he has been increasingly anxious about some things. Discussed medication and he does not want to take medication at this time. He will continue to exercise and limit salt intake and recheck in 1 month.   Genital Warts- Call office if not resolved in 2 weeks and we can consider aldara cream.  Cryotherapy Procedure Note  Pre-operative Diagnosis: Genital warts  Post-operative Diagnosis: Genital warts  Locations: dorsum base penis  Indications: discomfort   Anesthesia: not required   Procedure Details  History of allergy to iodine: no. Pacemaker? no.  Patient informed of risks (permanent scarring, infection, light or dark discoloration, bleeding, infection, weakness, numbness and recurrence of the lesion) and benefits of the procedure and verbal informed consent obtained.  The areas are treated with liquid nitrogen therapy, frozen until ice  ball extended 3 mm beyond lesion, allowed to thaw, and treated again x 3. The patient tolerated procedure well.  The patient was instructed on post-op care, warned that there may be blister formation, redness and pain. Recommend OTC analgesia as needed for pain.  Condition: Stable  Complications: none.  Plan: 1. Instructed to keep the area dry and covered for 24-48h and clean thereafter. 2. Warning signs of infection were reviewed.   3. Recommended that the patient use OTC acetaminophen as needed for pain.  4. Call office in 2 weeks if not resolved and we will consider treatment with a cream (aldara).

## 2011-04-20 NOTE — Patient Instructions (Signed)
Call office if warts not resolved in 2 weeks we will consider aldara cream.

## 2011-04-30 ENCOUNTER — Telehealth: Payer: Self-pay | Admitting: *Deleted

## 2011-04-30 MED ORDER — IMIQUIMOD 5 % EX CREA: TOPICAL_CREAM | CUTANEOUS | Status: DC

## 2011-04-30 NOTE — Telephone Encounter (Signed)
Pt informed

## 2011-04-30 NOTE — Telephone Encounter (Signed)
Pt states that he stills has about 4 areas that are still there that have blistered up. States you told him that if they didn't all go away after the 2nd freeze that you could call him in some cream. States some still have some drainage. Is cleaning the area 2-3 times daily. Please advise.

## 2011-04-30 NOTE — Telephone Encounter (Signed)
Sent cream to pharmacy to apply to affected areas three times a week at bedtime. Continue treatment until all areas are cleared or 16 weeks. Follow up if not resolved.

## 2011-05-11 ENCOUNTER — Ambulatory Visit (INDEPENDENT_AMBULATORY_CARE_PROVIDER_SITE_OTHER): Payer: Medicare Other | Admitting: Licensed Clinical Social Worker

## 2011-05-11 DIAGNOSIS — F419 Anxiety disorder, unspecified: Secondary | ICD-10-CM

## 2011-05-11 DIAGNOSIS — F411 Generalized anxiety disorder: Secondary | ICD-10-CM
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
## 2011-05-14 ENCOUNTER — Encounter: Payer: Self-pay | Admitting: Family Medicine

## 2011-05-14 ENCOUNTER — Ambulatory Visit (INDEPENDENT_AMBULATORY_CARE_PROVIDER_SITE_OTHER): Payer: Medicare Other | Admitting: Family Medicine

## 2011-05-14 VITALS — BP 137/87 | HR 63 | Ht 71.0 in | Wt 185.0 lb

## 2011-05-14 DIAGNOSIS — F17200 Nicotine dependence, unspecified, uncomplicated: Secondary | ICD-10-CM

## 2011-05-14 DIAGNOSIS — R030 Elevated blood-pressure reading, without diagnosis of hypertension: Secondary | ICD-10-CM

## 2011-05-14 DIAGNOSIS — Z23 Encounter for immunization: Secondary | ICD-10-CM

## 2011-05-14 DIAGNOSIS — Z720 Tobacco use: Secondary | ICD-10-CM

## 2011-05-14 NOTE — Progress Notes (Signed)
  Subjective:    Patient ID: Jordan Herrera, male    DOB: 1965-07-28, 46 y.o.   MRN: [id number]  HPI HTN f/u - Says his stress is much better.  Has been exercising more.  No CP or SOB. He has really been trying to improve his BP.  He is not on any medications for this. He does have a family history.   Review of Systems     Objective:   Physical Exam  Constitutional: He is oriented to person, place, and time. He appears well-developed and well-nourished.  HENT:  Head: Normocephalic and atraumatic.  Cardiovascular: Normal rate, regular rhythm and normal heart sounds.   Pulmonary/Chest: Effort normal and breath sounds normal.  Neurological: He is alert and oriented to person, place, and time.  Skin: Skin is warm and dry.  Psychiatric: He has a normal mood and affect. His behavior is normal.          Assessment & Plan:  HTN - BP is better today. Has been exercising more and eating low salt diet.  F/U in 6 months. Due for CMP and lipids. I explained him that he is still borderline so he will need to continue to be followed.  Still chew tobacco.  Dicussed need for cessation. Patient says he has been working on trying to cut back. Given Pneumonia vaccine

## 2011-05-14 NOTE — Patient Instructions (Signed)

## 2011-05-15 LAB — LIPID PANEL
HDL: 45 mg/dL (ref >39)
LDL Cholesterol: 116 mg/dL — ABNORMAL HIGH (ref 0–99)
Total CHOL/HDL Ratio: 3.9 Ratio
VLDL: 14 mg/dL (ref 0–40)

## 2011-05-15 LAB — COMPLETE METABOLIC PANEL WITH GFR
ALT: 25 U/L (ref 0–53)
AST: 25 U/L (ref 0–37)
CO2: 27 mEq/L (ref 19–32)
GFR, Est African American: >89 mL/min
Sodium: 137 mEq/L (ref 135–145)
Total Bilirubin: 0.4 mg/dL (ref 0.3–1.2)
Total Protein: 7.7 g/dL (ref 6.0–8.3)

## 2011-05-26 ENCOUNTER — Other Ambulatory Visit: Payer: Self-pay | Admitting: Physician Assistant

## 2011-05-26 ENCOUNTER — Other Ambulatory Visit (HOSPITAL_COMMUNITY): Payer: Self-pay | Admitting: Psychiatry

## 2011-05-26 MED ORDER — QUETIAPINE FUMARATE 25 MG PO TABS: 25.0000 mg | ORAL_TABLET | Freq: Every day | ORAL | Status: DC

## 2011-05-31 ENCOUNTER — Other Ambulatory Visit: Payer: Self-pay | Admitting: *Deleted

## 2011-05-31 MED ORDER — IMIQUIMOD 5 % EX CREA: TOPICAL_CREAM | CUTANEOUS | Status: DC

## 2011-06-21 ENCOUNTER — Other Ambulatory Visit: Payer: Self-pay | Admitting: Family Medicine

## 2011-07-13 ENCOUNTER — Ambulatory Visit (HOSPITAL_COMMUNITY): Payer: Self-pay | Admitting: Psychiatry

## 2011-07-14 ENCOUNTER — Ambulatory Visit (HOSPITAL_COMMUNITY): Payer: Self-pay | Admitting: Licensed Clinical Social Worker

## 2011-07-27 ENCOUNTER — Telehealth: Payer: Self-pay | Admitting: *Deleted

## 2011-07-27 NOTE — Telephone Encounter (Signed)
Pt states he has some more warts on his hands and would like to know if you can send an rx for the cream given before or states he can come in on the 7th if he needs to. Please advise.

## 2011-07-27 NOTE — Telephone Encounter (Signed)
Ok to refill aldara (imiquimod)

## 2011-07-28 MED ORDER — IMIQUIMOD 5 % EX CREA: TOPICAL_CREAM | CUTANEOUS | Status: DC

## 2011-07-28 NOTE — Telephone Encounter (Signed)
LMOM

## 2011-08-09 ENCOUNTER — Ambulatory Visit (INDEPENDENT_AMBULATORY_CARE_PROVIDER_SITE_OTHER): Payer: Medicare Other | Admitting: Psychiatry

## 2011-08-09 ENCOUNTER — Encounter (HOSPITAL_COMMUNITY): Payer: Self-pay | Admitting: Psychiatry

## 2011-08-09 VITALS — BP 122/82 | Ht 71.5 in | Wt 178.0 lb

## 2011-08-09 DIAGNOSIS — F329 Major depressive disorder, single episode, unspecified: Secondary | ICD-10-CM

## 2011-08-09 DIAGNOSIS — R1115 Cyclical vomiting syndrome unrelated to migraine: Secondary | ICD-10-CM

## 2011-08-09 DIAGNOSIS — F411 Generalized anxiety disorder: Secondary | ICD-10-CM
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
## 2011-08-11 ENCOUNTER — Ambulatory Visit (HOSPITAL_COMMUNITY): Payer: Self-pay | Admitting: Licensed Clinical Social Worker

## 2011-08-12 ENCOUNTER — Telehealth (HOSPITAL_COMMUNITY): Payer: Self-pay

## 2011-08-12 ENCOUNTER — Other Ambulatory Visit (HOSPITAL_COMMUNITY): Payer: Self-pay | Admitting: Psychiatry

## 2011-08-12 DIAGNOSIS — F411 Generalized anxiety disorder: Secondary | ICD-10-CM

## 2011-08-12 MED ORDER — ALPRAZOLAM 1 MG PO TABS: 1.0000 mg | ORAL_TABLET | Freq: Two times a day (BID) | ORAL | Status: DC

## 2011-08-12 NOTE — Telephone Encounter (Signed)
Done

## 2011-08-26 ENCOUNTER — Encounter (HOSPITAL_COMMUNITY): Payer: Self-pay | Admitting: Psychiatry

## 2011-09-07 ENCOUNTER — Ambulatory Visit (HOSPITAL_COMMUNITY): Payer: Self-pay | Admitting: Licensed Clinical Social Worker

## 2011-09-08 ENCOUNTER — Other Ambulatory Visit: Payer: Self-pay | Admitting: Family Medicine

## 2011-09-08 ENCOUNTER — Other Ambulatory Visit (HOSPITAL_COMMUNITY): Payer: Self-pay | Admitting: Psychiatry

## 2011-09-08 MED ORDER — BUPROPION HCL ER (XL) 300 MG PO TB24: 300.0000 mg | ORAL_TABLET | Freq: Every day | ORAL | Status: DC

## 2011-09-15 ENCOUNTER — Telehealth: Payer: Self-pay | Admitting: *Deleted

## 2011-09-15 DIAGNOSIS — M25552 Pain in left hip: Secondary | ICD-10-CM

## 2011-09-15 MED ORDER — CYCLOBENZAPRINE HCL 10 MG PO TABS: 10.0000 mg | ORAL_TABLET | Freq: Every evening | ORAL | Status: DC | PRN

## 2011-09-15 NOTE — Telephone Encounter (Signed)
Pt is asking for refill on Flexeril. Please advise if we can refill. Please advise.

## 2011-09-15 NOTE — Telephone Encounter (Signed)
Rx sent to pharmacy and referral placed for PT for Left Hip pain. Pt states if he needs to be seen sooner than Sept. he will come in to see you.

## 2011-09-15 NOTE — Telephone Encounter (Signed)
Denied. He has been using it every night if asking for a refill. The last refill should have last much longer if using prn.

## 2011-09-15 NOTE — Telephone Encounter (Addendum)
Ok to refill for 30 tab, 0 RF. Recommend referral to PT if he is still having this much problelm. Back or shoulder pain?

## 2011-09-15 NOTE — Telephone Encounter (Signed)
Pt states he has taken a few nights in a row and states he isn't abusing anything. He would like to know what would be the next step to get him feeling better or will you refill them in the future?

## 2011-09-21 ENCOUNTER — Ambulatory Visit: Payer: Self-pay | Admitting: Physical Therapy

## 2011-10-05 ENCOUNTER — Ambulatory Visit (HOSPITAL_COMMUNITY): Payer: Self-pay | Admitting: Licensed Clinical Social Worker

## 2011-10-11 ENCOUNTER — Ambulatory Visit (HOSPITAL_COMMUNITY): Payer: Self-pay | Admitting: Psychiatry

## 2011-10-13 ENCOUNTER — Encounter (HOSPITAL_COMMUNITY): Payer: Self-pay | Admitting: Psychiatry

## 2011-10-13 ENCOUNTER — Ambulatory Visit (INDEPENDENT_AMBULATORY_CARE_PROVIDER_SITE_OTHER): Payer: Medicare Other | Admitting: Psychiatry

## 2011-10-13 VITALS — BP 122/82 | Ht 71.5 in | Wt 176.0 lb

## 2011-10-13 DIAGNOSIS — R1115 Cyclical vomiting syndrome unrelated to migraine: Secondary | ICD-10-CM

## 2011-10-13 DIAGNOSIS — F329 Major depressive disorder, single episode, unspecified: Secondary | ICD-10-CM

## 2011-10-13 DIAGNOSIS — F411 Generalized anxiety disorder: Secondary | ICD-10-CM

## 2011-10-13 NOTE — Progress Notes (Signed)
   [facility] Follow-up Outpatient Visit  Jordan Herrera [birth year]  Date: 01/13/11   Subjective:  The patient is a 46 year old male who has been seen here by myself at [facility] since January 2010.  He is currently diagnosed with generalized anxiety disorder,  depression and a cyclic vomiting disorder. He presents currently today. He did have his hearing with the DMV. They denied him. They stated that he was "90% there." Their main concern is that he is still driving. He has to wait one more year to appeal. He now has a new liver specialist. He is seeing Dr. Margaretha Glassing at [facility]. The patient has been alone the more irritable with everything going on. He is currently off of his Flexeril. He states that he may have had some mild withdrawal symptoms when he first came off it. 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 He feels that his medication for me helps his anxiety. He still is driving occasionally, and gets very anxious before he gets behind the wheel. He endorses good sleep and appetite. His weight is actually down 2 pounds today.  Filed Vitals:   10/13/11 1059  BP: 122/82    Mental Status Examination  Appearance: Somewhat disheveled but normal state for patient  Alert: Yes Attention: good  Cooperative: Yes Eye Contact: Good Speech: Regular rate rhythm and volume Psychomotor Activity: Normal Memory/Concentration: Intact to recent and remote Oriented: person, place, time/date and situation Mood: Euphoric Affect: Full Range Thought Processes and Associations: Logical Fund of Knowledge: Fair Thought Content: Patient denies any suicidal or homicidal thoughts Insight: Fair Judgement: Fair  Diagnosis: Generalized anxiety disorder, depression and cyclic vomiting  Treatment Plan: At this point we will not change his medications. We will continue him on the Seroquel, Wellbutrin XL, and Xanax. I'll see the  patient back in 3 months. Jamse Mead, MD

## 2011-10-22 ENCOUNTER — Ambulatory Visit (HOSPITAL_COMMUNITY): Payer: Self-pay | Admitting: Licensed Clinical Social Worker

## 2011-11-02 ENCOUNTER — Telehealth: Payer: Self-pay | Admitting: *Deleted

## 2011-11-02 ENCOUNTER — Other Ambulatory Visit (HOSPITAL_COMMUNITY): Payer: Self-pay | Admitting: Psychiatry

## 2011-11-02 ENCOUNTER — Other Ambulatory Visit: Payer: Self-pay | Admitting: Family Medicine

## 2011-11-02 MED ORDER — QUETIAPINE FUMARATE 25 MG PO TABS: 25.0000 mg | ORAL_TABLET | Freq: Every day | ORAL | Status: DC

## 2011-11-02 NOTE — Telephone Encounter (Signed)
Pt called and wanted to know if he could get a refill on aldara. He states he has some lesions on his hands that are warts. Pt already has appt on 9/9 for 6 month f/u. Called and advised pt the he needs appt to have lesions on the hand eval.pt voiced understanding

## 2011-11-15 ENCOUNTER — Ambulatory Visit (INDEPENDENT_AMBULATORY_CARE_PROVIDER_SITE_OTHER): Payer: Medicare Other

## 2011-11-15 ENCOUNTER — Encounter: Payer: Self-pay | Admitting: Family Medicine

## 2011-11-15 ENCOUNTER — Ambulatory Visit (INDEPENDENT_AMBULATORY_CARE_PROVIDER_SITE_OTHER): Payer: Medicare Other | Admitting: Family Medicine

## 2011-11-15 VITALS — BP 150/100 | HR 86 | Ht 71.0 in | Wt 174.0 lb

## 2011-11-15 DIAGNOSIS — M25559 Pain in unspecified hip: Secondary | ICD-10-CM

## 2011-11-15 DIAGNOSIS — N289 Disorder of kidney and ureter, unspecified: Secondary | ICD-10-CM

## 2011-11-15 DIAGNOSIS — R7309 Other abnormal glucose: Secondary | ICD-10-CM

## 2011-11-15 DIAGNOSIS — M169 Osteoarthritis of hip, unspecified: Secondary | ICD-10-CM

## 2011-11-15 DIAGNOSIS — B079 Viral wart, unspecified: Secondary | ICD-10-CM

## 2011-11-15 DIAGNOSIS — M1610 Unilateral primary osteoarthritis, unspecified hip: Secondary | ICD-10-CM

## 2011-11-15 DIAGNOSIS — M25552 Pain in left hip: Secondary | ICD-10-CM

## 2011-11-15 DIAGNOSIS — E785 Hyperlipidemia, unspecified: Secondary | ICD-10-CM

## 2011-11-15 MED ORDER — IMIQUIMOD 5 % EX CREA: TOPICAL_CREAM | CUTANEOUS | Status: DC

## 2011-11-15 MED ORDER — CYCLOBENZAPRINE HCL 10 MG PO TABS: 10.0000 mg | ORAL_TABLET | Freq: Every evening | ORAL | Status: DC | PRN
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
## 2011-11-15 NOTE — Patient Instructions (Signed)
We will call you with the x-ray results. 

## 2011-11-16 ENCOUNTER — Encounter: Payer: Self-pay | Admitting: *Deleted

## 2011-11-16 LAB — BASIC METABOLIC PANEL WITH GFR
Calcium: 9.9 mg/dL (ref 8.4–10.5)
Creat: 1.08 mg/dL (ref 0.50–1.35)
GFR, Est African American: >89 mL/min
GFR, Est Non African American: 82 mL/min

## 2011-11-16 LAB — LDL CHOLESTEROL, DIRECT: Direct LDL: 87 mg/dL

## 2011-11-16 NOTE — Progress Notes (Signed)
Quick Note:  All labs are normal. ______ 

## 2011-11-29 ENCOUNTER — Telehealth (HOSPITAL_COMMUNITY): Payer: Self-pay | Admitting: Psychiatry

## 2011-11-29 NOTE — Telephone Encounter (Signed)
Pharmacy sent autofax refill request.Called patient. Patient denies SI/HI/AVH. Patient reports he is doing well on his current medications and denies any side effects.   PLAN: Will continue this medication. Called the pharmacy, the patient has refills of the medication currently.

## 2011-12-23 ENCOUNTER — Other Ambulatory Visit: Payer: Self-pay | Admitting: Family Medicine

## 2012-01-11 ENCOUNTER — Ambulatory Visit (INDEPENDENT_AMBULATORY_CARE_PROVIDER_SITE_OTHER): Payer: Medicare Other | Admitting: Psychiatry

## 2012-01-11 ENCOUNTER — Encounter (HOSPITAL_COMMUNITY): Payer: Self-pay | Admitting: Psychiatry

## 2012-01-11 VITALS — BP 108/62 | Ht 71.5 in | Wt 181.0 lb

## 2012-01-11 DIAGNOSIS — R1115 Cyclical vomiting syndrome unrelated to migraine: Secondary | ICD-10-CM

## 2012-01-11 DIAGNOSIS — F329 Major depressive disorder, single episode, unspecified: Secondary | ICD-10-CM

## 2012-01-11 DIAGNOSIS — F411 Generalized anxiety disorder: Secondary | ICD-10-CM

## 2012-01-11 NOTE — Progress Notes (Signed)
    Health Follow-up Outpatient Visit  Jordan Herrera 1966/01/23  Date: 01/13/11   Subjective:  The patient is a 46 year old male who has been seen here by myself at [facility] since January 2010.  He is currently diagnosed with generalized anxiety disorder,  depression and a cyclic vomiting disorder. At his last appointment, I did not make any changes. He presents today. 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 His son is in sixth grade and doing very well. The patient is still having episodes of emesis. He feels that his anxiety is okay. There has been no word on his driver's license. He will not hear anything until July secondary to a one-year waiting period. The patient endorses good sleep and appetite. He's wearing boots, but is up 5 pounds today. He has no concerns. He does not feel the need for therapy at this time, but knows she can schedule if necessary.  Filed Vitals:   01/11/12 1029  BP: 108/62    Mental Status Examination  Appearance: Casually dressed  Alert: Yes Attention: good  Cooperative: Yes Eye Contact: Good Speech: Regular rate rhythm and volume Psychomotor Activity: Normal Memory/Concentration: Intact to recent and remote Oriented: person, place, time/date and situation Mood: Euphoric Affect: Full Range Thought Processes and Associations: Logical Fund of Knowledge: Fair Thought Content: Patient denies any suicidal or homicidal thoughts Insight: Fair Judgement: Fair  Diagnosis: Generalized anxiety disorder, depression and cyclic vomiting  Treatment Plan: At this point we will not change his medications. We will continue him on the Seroquel, Wellbutrin XL, and Xanax. I'll see the patient back in 3 months. Jamse Mead, MD

## 2012-01-20 ENCOUNTER — Other Ambulatory Visit (HOSPITAL_COMMUNITY): Payer: Self-pay | Admitting: Psychiatry

## 2012-01-20 DIAGNOSIS — F411 Generalized anxiety disorder: Secondary | ICD-10-CM

## 2012-01-20 MED ORDER — ALPRAZOLAM 1 MG PO TABS: 1.0000 mg | ORAL_TABLET | Freq: Two times a day (BID) | ORAL | Status: DC

## 2012-02-14 ENCOUNTER — Telehealth (HOSPITAL_COMMUNITY): Payer: Self-pay | Admitting: Psychiatry

## 2012-02-14 MED ORDER — BUPROPION HCL ER (XL) 300 MG PO TB24: 300.0000 mg | ORAL_TABLET | Freq: Every day | ORAL | Status: DC

## 2012-02-14 NOTE — Telephone Encounter (Signed)
Will fill fax request for Wellbutrin XL 150 mg daily.   PLAN:  1. Will fill 30 day supply with 2 refills. Patient has appointment 04/12/2012.

## 2012-04-06 ENCOUNTER — Other Ambulatory Visit: Payer: Self-pay | Admitting: Family Medicine

## 2012-04-06 MED ORDER — TIZANIDINE HCL 4 MG PO TABS: 4.0000 mg | ORAL_TABLET | Freq: Every evening | ORAL | Status: DC | PRN

## 2012-04-07 ENCOUNTER — Other Ambulatory Visit (HOSPITAL_COMMUNITY): Payer: Self-pay | Admitting: Psychiatry

## 2012-04-07 MED ORDER — QUETIAPINE FUMARATE 25 MG PO TABS: 25.0000 mg | ORAL_TABLET | Freq: Every day | ORAL | Status: DC

## 2012-04-12 ENCOUNTER — Encounter (HOSPITAL_COMMUNITY): Payer: Self-pay | Admitting: Psychiatry

## 2012-04-12 ENCOUNTER — Ambulatory Visit (INDEPENDENT_AMBULATORY_CARE_PROVIDER_SITE_OTHER): Payer: Medicare Other | Admitting: Psychiatry

## 2012-04-12 VITALS — BP 115/72 | Ht 71.5 in | Wt 184.0 lb

## 2012-04-12 DIAGNOSIS — F411 Generalized anxiety disorder: Secondary | ICD-10-CM

## 2012-04-12 DIAGNOSIS — F329 Major depressive disorder, single episode, unspecified: Secondary | ICD-10-CM

## 2012-04-12 DIAGNOSIS — F331 Major depressive disorder, recurrent, moderate: Secondary | ICD-10-CM

## 2012-04-12 DIAGNOSIS — R1115 Cyclical vomiting syndrome unrelated to migraine: Secondary | ICD-10-CM

## 2012-04-12 MED ORDER — BUPROPION HCL ER (XL) 300 MG PO TB24: 300.0000 mg | ORAL_TABLET | Freq: Every day | ORAL | Status: DC

## 2012-04-12 NOTE — Progress Notes (Signed)
   [facility] Follow-up Outpatient Visit  Jordan Herrera [birth year]  Date: 01/13/11   Subjective:  The patient is a 47 year old male who has been seen here by myself at [facility] since January 2010.  He is currently diagnosed with generalized anxiety disorder,  depression and a cyclic vomiting disorder. At his last appointment, I did not make any changes. He presents today. His son and he had been going to the park and throwing a football. His son one the championship for his football team. He is very proud of him. His son has all A's and is on student permit. The patient has had to cancel his previous gastroenterology appointments. He does have a followup appointment in March. He will do pretty well, but then have a flare of the vomiting. He sleeps well with the Seroquel. He did have an incident a few months ago where he had the sensation of itching and bugs crawling on his skin. He thinks it was secondary to muscle relaxer he was taking. He has not been taking it. The patient feels like his anxiety is under control. He endorses good sleep and appetite. He is up another 3 pounds today.  Filed Vitals:   04/12/12 1044  BP: 115/72    Mental Status Examination  Appearance: Casually dressed  Alert: Yes Attention: good  Cooperative: Yes Eye Contact: Good Speech: Regular rate rhythm and volume Psychomotor Activity: Normal Memory/Concentration: Intact to recent and remote Oriented: person, place, time/date and situation Mood: Euphoric Affect: Full Range Thought Processes and Associations: Logical Fund of Knowledge: Fair Thought Content: Patient denies any suicidal or homicidal thoughts Insight: Fair Judgement: Fair  Diagnosis: Generalized anxiety disorder, depression and cyclic vomiting  Treatment Plan: At this point I will not change his medications. I will continue him on the Seroquel, Wellbutrin XL, and Xanax. I'll see the patient back in 3  months. Jamse Mead, MD

## 2012-05-04 ENCOUNTER — Other Ambulatory Visit (HOSPITAL_COMMUNITY): Payer: Self-pay | Admitting: Psychiatry

## 2012-05-15 ENCOUNTER — Ambulatory Visit (INDEPENDENT_AMBULATORY_CARE_PROVIDER_SITE_OTHER): Payer: Medicare Other | Admitting: Family Medicine

## 2012-05-15 ENCOUNTER — Encounter: Payer: Self-pay | Admitting: Family Medicine

## 2012-05-15 VITALS — BP 132/81 | HR 75 | Ht 71.0 in | Wt 175.0 lb

## 2012-05-15 DIAGNOSIS — M25552 Pain in left hip: Secondary | ICD-10-CM

## 2012-05-15 DIAGNOSIS — F411 Generalized anxiety disorder: Secondary | ICD-10-CM

## 2012-05-15 DIAGNOSIS — M25559 Pain in unspecified hip: Secondary | ICD-10-CM

## 2012-05-15 DIAGNOSIS — Z13220 Encounter for screening for lipoid disorders: Secondary | ICD-10-CM

## 2012-05-15 DIAGNOSIS — Z5181 Encounter for therapeutic drug level monitoring: Secondary | ICD-10-CM

## 2012-05-15 NOTE — Patient Instructions (Addendum)
Hip Pain  The hips join the upper legs to the lower pelvis. The bones, cartilage, tendons, and muscles of the hip joint perform a lot of work each day holding your body weight and allowing you to move around.  Hip pain is a common symptom. It can range from a minor ache to severe pain on 1 or both hips. Pain may be felt on the inside of the hip joint near the groin, or the outside near the buttocks and upper thigh. There may be swelling or stiffness as well. It occurs more often when a person walks or performs activity. There are many reasons hip pain can develop.  CAUSES   It is important to work with your caregiver to identify the cause since many conditions can impact the bones, cartilage, muscles, and tendons of the hips. Causes for hip pain include:   Broken (fractured) bones.   Separation of the thighbone from the hip socket (dislocation).   Torn cartilage of the hip joint.   Swelling (inflammation) of a tendon (tendonitis), the sac within the hip joint (bursitis), or a joint.   A weakening in the abdominal wall (hernia), affecting the nerves to the hip.   Arthritis in the hip joint or lining of the hip joint.   Pinched nerves in the back, hip, or upper thigh.   A bulging disc in the spine (herniated disc).   Rarely, bone infection or cancer.  DIAGNOSIS   The location of your hip pain will help your caregiver understand what may be causing the pain. A diagnosis is based on your medical history, your symptoms, results from your physical exam, and results from diagnostic tests. Diagnostic tests may include X-ray exams, a computerized magnetic scan (magnetic resonance imaging, MRI), or bone scan.  TREATMENT   Treatment will depend on the cause of your hip pain. Treatment may include:   Limiting activities and resting until symptoms improve.   Crutches or other walking supports (a cane or brace).   Ice, elevation, and compression.   Physical therapy or home exercises.    Shoe inserts or special shoes.   Losing weight.   Medications to reduce pain.   Undergoing surgery.  HOME CARE INSTRUCTIONS    Only take over-the-counter or prescription medicines for pain, discomfort, or fever as directed by your caregiver.   Put ice on the injured area:   Put ice in a plastic bag.   Place a towel between your skin and the bag.   Leave the ice on for 15 to 20 minutes at a time, 3 to 4 times a day.   Keep your leg raised (elevated) when possible to lessen swelling.   Avoid activities that cause pain.   Follow specific exercises as directed by your caregiver.   Sleep with a pillow between your legs on your most comfortable side.   Record how often you have hip pain, the location of the pain, and what it feels like. This information may be helpful to you and your caregiver.   Ask your caregiver about returning to work or sports and whether you should drive.   Follow up with your caregiver for further exams, therapy, or testing as directed.  SEEK MEDICAL CARE IF:    Your pain or swelling continues or worsens after 1 week.   You are feeling unwell or have chills.   You have increasing difficulty with walking.   You have a loss of sensation or other new symptoms.   You have questions   or concerns.  SEEK IMMEDIATE MEDICAL CARE IF:    You cannot put weight on the affected hip.   You have fallen.   You have a sudden increase in pain and swelling in your hip.   You have a fever.  MAKE SURE YOU:    Understand these instructions.   Will watch your condition.   Will get help right away if you are not doing well or get worse.  Document Released: 08/12/2009 Document Revised: 05/17/2011 Document Reviewed: 08/12/2009  [employer] Patient Information 2013 [employer].

## 2012-05-15 NOTE — Progress Notes (Signed)
  Subjective:    Patient ID: Jordan Herrera, male    DOB: 1965/12/16, 47 y.o.   MRN: [id number]  HPI Left hip pain over the outer left hip for > 1 years. Comes and goes.  Says has been more calm this month.  Says the Aleve and muscle relaxers help.  Says most of the time his pain is a dull ache but occasionally it becomes more severe and actually keeps him awake at night. He says the Aleve doesn't seem to bother her stomach at least at this point in time.  GAD- has been working with Dr. Christell Constant.  He is happy with his current regimen. No complaints or side effects. He is on Seroquel for several months.  Review of Systems     Objective:   Physical Exam  Constitutional: He is oriented to person, place, and time. He appears well-developed and well-nourished.  HENT:  Head: Normocephalic and atraumatic.  Cardiovascular: Normal rate, regular rhythm and normal heart sounds.   Pulmonary/Chest: Effort normal and breath sounds normal.  Musculoskeletal:  Hip with normal flexion and range of motion. Hip strength is 5 out of 5. Normal Faber test normal Fadir test. Nontender over the greater trochanter. That he's not having a lot of pain or discomfort today.  Neurological: He is alert and oriented to person, place, and time.  Skin: Skin is warm and dry.  Psychiatric: He has a normal mood and affect. His behavior is normal.          Assessment & Plan:  Left hip pain - exam is fairly normal today but he does have some arthritis on x-ray that was done back in September. I encouraged him to see my partner Dr. Rodney Langton next time he has a flare with his hip so they can discuss further treatment plans and possible evaluation. Encouraged regular exercise. Gave him a business card.   GAD- since on seroquel will check A1C. no sign of diabetes. This is reassuring. I did recommend that he is going to continue this medication that we monitor him once a year with a hemoglobin A1c. I suspect he is low  risk for developing any hypoglycemia he is on such a small dose but this is still a potential side effect. Lab Results  Component Value Date   HGBA1C 5.1 05/15/2012    Due for screening lipids and CMP. Lab slip given he will go on another day when he is able to fast.

## 2012-06-05 ENCOUNTER — Encounter: Payer: Self-pay | Admitting: Sports Medicine

## 2012-06-05 ENCOUNTER — Ambulatory Visit (INDEPENDENT_AMBULATORY_CARE_PROVIDER_SITE_OTHER): Payer: Medicare Other | Admitting: Sports Medicine

## 2012-06-05 VITALS — BP 150/85 | HR 87 | Wt 180.0 lb

## 2012-06-05 DIAGNOSIS — M1612 Unilateral primary osteoarthritis, left hip: Secondary | ICD-10-CM | POA: Insufficient documentation

## 2012-06-05 DIAGNOSIS — M1610 Unilateral primary osteoarthritis, unspecified hip: Secondary | ICD-10-CM

## 2012-06-05 DIAGNOSIS — M169 Osteoarthritis of hip, unspecified: Secondary | ICD-10-CM

## 2012-06-05 LAB — COMPLETE METABOLIC PANEL WITH GFR
ALT: 19 U/L (ref 0–53)
Albumin: 4.4 g/dL (ref 3.5–5.2)
CO2: 29 mEq/L (ref 19–32)
GFR, Est African American: 88 mL/min
GFR, Est Non African American: 76 mL/min
Glucose, Bld: 91 mg/dL (ref 70–99)
Potassium: 5.2 mEq/L (ref 3.5–5.3)
Sodium: 139 mEq/L (ref 135–145)
Total Protein: 7.1 g/dL (ref 6.0–8.3)

## 2012-06-05 LAB — LIPID PANEL: Cholesterol: 148 mg/dL (ref 0–200)

## 2012-06-05 LAB — LDL CHOLESTEROL, DIRECT: Direct LDL: 81 mg/dL

## 2012-06-05 MED ORDER — MELOXICAM 15 MG PO TABS: ORAL_TABLET | ORAL | Status: DC

## 2012-06-05 NOTE — Assessment & Plan Note (Signed)
Range of motion severely limited to about 10 degrees of internal rotation, painful. We will start conservatively with hip flexor and abductor rehab, mobic. Return in 4 weeks, if no better can consider intra-articular injection.

## 2012-06-05 NOTE — Progress Notes (Signed)
   Subjective:    I'm seeing this patient as a consultation for:  Dr. 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 He does remember trauma in the distant past while playing football. Pain is worse with walking, better with rest. He's been doing a lot of squats but has not been doing any hip flexor exercises. He's also been neglecting the hip abductors. He hasn't really used any ibuprofen, has used a touch of naproxen here and there.  Past medical history, Surgical history, Family history not pertinant except as noted below, Social history, Allergies, and medications have been entered into the medical record, reviewed, and no changes needed.   Review of Systems: No headache, visual changes, nausea, vomiting, diarrhea, constipation, dizziness, abdominal pain, skin rash, fevers, chills, night sweats, weight loss, swollen lymph nodes, body aches, joint swelling, muscle aches, chest pain, shortness of breath, mood changes, visual or auditory hallucinations.   Objective:   General: Well Developed, well nourished, and in no acute distress.  Neuro/Psych: Alert and oriented x3, extra-ocular muscles intact, able to move all 4 extremities, sensation grossly intact. Skin: Warm and dry, no rashes noted.  Respiratory: Not using accessory muscles, speaking in full sentences, trachea midline.  Cardiovascular: Pulses palpable, no extremity edema. Abdomen: Does not appear distended. Left Hip: ROM IR: 10 Deg and painful, ER: 45 Deg, Flexion: 120 Deg, Extension: 100 Deg, Abduction: 45 Deg, Adduction: 45 Deg Strength IR: 5/5, ER: 5/5, Flexion: 4/5, Extension: 5/5, Abduction: 5/5, Adduction: 5/5 Pelvic alignment unremarkable to inspection and palpation. Standing hip rotation and gait without trendelenburg sign / unsteadiness. Greater trochanter without tenderness to  palpation. No tenderness over piriformis and greater trochanter. No pain with FABER or FADIR. No SI joint tenderness and normal minimal SI movement.  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  Impression and Recommendations:   This case required medical decision making of moderate complexity.

## 2012-06-05 NOTE — Progress Notes (Signed)
Quick Note:  All labs are normal. ______ 

## 2012-06-05 NOTE — Patient Instructions (Signed)
Hip Rehabilitation Protocol:  1.  Side leg raises.  3x30 with no weight, then 3x15 with 2 lb ankle weight, then 3x15 with 5 lb ankle weight 2.  Standing hip rotation.  3x30 with no weight, then 3x15 with 2 lb ankle weight, then 3x15 with 5 lb ankle weight. 3.  Side step ups.  3x30 with no weight, then 3x15 with 5 lbs in backpack, then 3x15 with 10 lbs in backpack. 

## 2012-06-24 ENCOUNTER — Other Ambulatory Visit (HOSPITAL_COMMUNITY): Payer: Self-pay | Admitting: Psychiatry

## 2012-07-03 ENCOUNTER — Ambulatory Visit: Payer: Self-pay | Admitting: Sports Medicine

## 2012-07-10 ENCOUNTER — Ambulatory Visit (INDEPENDENT_AMBULATORY_CARE_PROVIDER_SITE_OTHER): Payer: Medicare Other | Admitting: Sports Medicine

## 2012-07-10 ENCOUNTER — Ambulatory Visit (INDEPENDENT_AMBULATORY_CARE_PROVIDER_SITE_OTHER): Payer: Medicare Other | Admitting: Psychiatry

## 2012-07-10 ENCOUNTER — Encounter: Payer: Self-pay | Admitting: Sports Medicine

## 2012-07-10 ENCOUNTER — Encounter (HOSPITAL_COMMUNITY): Payer: Self-pay | Admitting: Psychiatry

## 2012-07-10 VITALS — BP 142/96 | HR 83 | Wt 174.0 lb

## 2012-07-10 VITALS — BP 138/90 | Ht 71.5 in | Wt 175.0 lb

## 2012-07-10 DIAGNOSIS — M1610 Unilateral primary osteoarthritis, unspecified hip: Secondary | ICD-10-CM

## 2012-07-10 DIAGNOSIS — M169 Osteoarthritis of hip, unspecified: Secondary | ICD-10-CM

## 2012-07-10 DIAGNOSIS — M1612 Unilateral primary osteoarthritis, left hip: Secondary | ICD-10-CM

## 2012-07-10 DIAGNOSIS — F411 Generalized anxiety disorder: Secondary | ICD-10-CM

## 2012-07-10 DIAGNOSIS — F331 Major depressive disorder, recurrent, moderate: Secondary | ICD-10-CM

## 2012-07-10 DIAGNOSIS — F329 Major depressive disorder, single episode, unspecified: Secondary | ICD-10-CM

## 2012-07-10 NOTE — Progress Notes (Signed)
   Subjective:    CC: Followup  HPI: He comes back for follow up of pain that he has in his left hip, x-rays in the past have showed osteoarthritis as well as anatomy that may predispose to femoral acetabular impingement. Symptoms were predominantly arthritic at the last visit, I started him on Mobic, and hip flexor rehabilitation program. He returns today essentially pain-free.  Past medical history, Surgical history, Family history not pertinant except as noted below, Social history, Allergies, and medications have been entered into the medical record, reviewed, and no changes needed.   Review of Systems: No headache, visual changes, nausea, vomiting, diarrhea, constipation, dizziness, abdominal pain, skin rash, fevers, chills, night sweats, weight loss, swollen lymph nodes, body aches, joint swelling, muscle aches, chest pain, shortness of breath, mood changes, visual or auditory hallucinations.   Objective:   General: Well Developed, well nourished, and in no acute distress.  Neuro/Psych: Alert and oriented x3, extra-ocular muscles intact, able to move all 4 extremities, sensation grossly intact. Skin: Warm and dry, no rashes noted.  Respiratory: Not using accessory muscles, speaking in full sentences, trachea midline.  Cardiovascular: Pulses palpable, no extremity edema. Abdomen: Does not appear distended. Impression and Recommendations:   This case required medical decision making of moderate complexity.

## 2012-07-10 NOTE — Progress Notes (Signed)
[facility] Follow-up Outpatient Visit  Jordan Herrera 1966/01/27  Date: 01/13/11   Subjective:  The patient is a 47 year old male who has been seen here by myself at [facility] since January 2010.  He is currently diagnosed with generalized anxiety disorder,  depression and a cyclic vomiting disorder. At his last appointment, I did not make any changes. He presents today. He has seen his gastroenterologist since our last appointment. Everything checked out well. The patient's vomiting is under better control. There is a new hepatitis treatment coming out and December that his gastroenterologist wants to try. He's currently in remission with his hepatitis C. The patient is sleeping well with Seroquel. Anxiety is under control. He's not dating but is getting "out and about". His son will be starting middle school. 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 He is hopefully getting his license back on July 14. He did have a friend drive him today. The patient is down 9 pounds today. He reports that he has been working out.  Filed Vitals:   07/10/12 1034  BP: 138/90   Active Ambulatory Problems    Diagnosis Date Noted  . GAD (generalized anxiety disorder) 12/22/2007  . GERD, SEVERE 12/10/2009  . PEPTIC ULCER DISEASE 12/22/2007  . SHOULDER PAIN, LEFT 02/22/2008  . HAND PAIN, RIGHT 12/18/2008  . LOSS OF WEIGHT 12/22/2007  . 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 Penile wart 08/12/2010  . Mood disorder 02/10/2011  . Osteoarthritis of left hip 06/05/2012   Resolved Ambulatory Problems    Diagnosis Date Noted  . Wheezing 02/22/2008  . FLANK PAIN, RIGHT 12/10/2009   Past Medical History  Diagnosis Date  . Depression   . Anxiety   . Eating disorder   . PUD (peptic ulcer disease)   . Unspecified viral hepatitis C without hepatic coma    Current Outpatient Prescriptions on File Prior to Visit  Medication Sig Dispense Refill  . ALPRAZolam (XANAX) 1 MG  tablet TAKE 1 TABLET BY MOUTH TWICE A DAY AS NEEDED  60 tablet  5  . buPROPion (WELLBUTRIN XL) 300 MG 24 hr tablet Take 1 tablet (300 mg total) by mouth daily.  30 tablet  2  . imiquimod (ALDARA) 5 % cream Apply topically 3 (three) times a week. Apply until total clearance or 16 weeks whichever comes first.  12 each  1  . meloxicam (MOBIC) 15 MG tablet One tab PO qAM with breakfast for 2 weeks, then daily prn pain.  30 tablet  3  . omeprazole (PRILOSEC) 40 MG capsule       . promethazine (PHENERGAN) 25 MG tablet       . QUEtiapine (SEROQUEL) 25 MG tablet Take 1 tablet (25 mg total) by mouth at bedtime.  30 tablet  5  . tiZANidine (ZANAFLEX) 4 MG tablet Take 1 tablet (4 mg total) by mouth at bedtime as needed. PRN muscle spasm  30 tablet  0   No current facility-administered medications on file prior to visit.   Review of Systems - General ROS: negative for - sleep disturbance or weight gain Psychological ROS: negative for - anxiety or depression Cardiovascular ROS: no chest pain or dyspnea on exertion Musculoskeletal ROS: negative for - gait disturbance or muscular weakness Neurological ROS: negative for - headaches or seizures  Mental Status Examination  Appearance: Casually dressed  Alert: Yes Attention: good  Cooperative: Yes Eye Contact: Good Speech: Regular rate rhythm and volume Psychomotor Activity: Normal Memory/Concentration: Intact  to recent and remote Oriented: person, place, time/date and situation Mood: Euphoric Affect: Full Range Thought Processes and Associations: Logical Fund of Knowledge: Fair Thought Content: Patient denies any suicidal or homicidal thoughts Insight: Fair Judgement: Fair  Diagnosis: Generalized anxiety disorder, depression and cyclic vomiting  Treatment Plan: At this point I will not change his medications. I will continue him on the Seroquel, Wellbutrin XL, and Xanax. I'll see the patient back in 3 months. Patient may call with  concerns. Jamse Mead, MD

## 2012-07-10 NOTE — Assessment & Plan Note (Signed)
Jordan Herrera is approximately 100% better with simply home exercises and Mobic. He did have an x-ray that shows femoral acetabular DJD with anatomy that could predispose to femoral acetabular impingement. I advised him to continue his exercises, he can come see me back on an as-needed basis, if pain continues certainly we can consider intra-articular injection.

## 2012-07-21 ENCOUNTER — Other Ambulatory Visit (HOSPITAL_COMMUNITY): Payer: Self-pay | Admitting: Psychiatry

## 2012-07-21 ENCOUNTER — Other Ambulatory Visit: Payer: Self-pay | Admitting: Family Medicine

## 2012-10-03 ENCOUNTER — Ambulatory Visit (INDEPENDENT_AMBULATORY_CARE_PROVIDER_SITE_OTHER): Payer: Medicare Other | Admitting: Family Medicine

## 2012-10-03 ENCOUNTER — Encounter: Payer: Self-pay | Admitting: Family Medicine

## 2012-10-03 VITALS — BP 136/82 | HR 73 | Ht 72.0 in | Wt 164.0 lb

## 2012-10-03 DIAGNOSIS — M79674 Pain in right toe(s): Secondary | ICD-10-CM

## 2012-10-03 DIAGNOSIS — M79609 Pain in unspecified limb: Secondary | ICD-10-CM

## 2012-10-03 DIAGNOSIS — IMO0002 Reserved for concepts with insufficient information to code with codable children: Secondary | ICD-10-CM

## 2012-10-03 NOTE — Progress Notes (Signed)
  Subjective:    Patient ID: [name], male    DOB: Dec 20, 1965, 47 y.o.   MRN: [id number]  HPI Noticed a knot under the skin on the right foot between the first and 2nd toe.  Had glass in it at one time about 4-5 years ago and ever since then he has felt a knot. Says sometimes it feels like there's a rock in between his toes. It's on his middle toe.. This morning woke up and it was raw and tender feeling. He noticed some pus draining out of it after he took his shower. He then tried to squeeze it and press it to get all the pus out and to see if there was any glass that might come out. Thought maybe it was coming to surface.    Review of Systems     Objective:   Physical Exam  Constitutional: He appears well-developed and well-nourished.  HENT:  Head: Normocephalic and atraumatic.  Musculoskeletal:  Middle toe with 2 small lesions that appear irritated and raw.  No sign of active drainage or pus.  Skin: Skin is warm and dry.  Psychiatric: He has a normal mood and affect. His behavior is normal.          Assessment & Plan:  Foreign body-incision and drainage did not reveal any foreign body. There is no active pus on exam today so I did not do a wound culture. Please see the note below. Recommend apply triple and applied ointment after dressing changes. Call if any problems or concerns or side effects and.  Incision and Drainage Procedure Note  Pre-operative Diagnosis: Foreign body  Post-operative Diagnosis: same  Indications: Pain, tenderness and drainage.  Anesthesia: 1% plain lidocaine  Procedure Details  The procedure, risks and complications have been discussed in detail (including, but not limited to airway compromise, infection, bleeding) with the patient, and the patient has signed consent to the procedure.  The skin was sterilely prepped and draped over the affected area in the usual fashion. After adequate local anesthesia, I&D with a #11 blade was  performed on the middle toe. Purulent drainage: absent.  The wound was probed for glass. Was unable to feel or see anything. Patient tolerated the procedure well. The patient was observed until stable.  Findings: No foreign body. No active pus or drainage.  EBL: 0.5 cc's  Drains: none  Condition: Tolerated procedure well   Complications: none.

## 2012-10-06 ENCOUNTER — Other Ambulatory Visit (HOSPITAL_COMMUNITY): Payer: Self-pay | Admitting: Psychiatry

## 2012-10-06 ENCOUNTER — Other Ambulatory Visit: Payer: Self-pay | Admitting: Sports Medicine

## 2012-10-06 ENCOUNTER — Other Ambulatory Visit: Payer: Self-pay | Admitting: Family Medicine

## 2012-10-09 ENCOUNTER — Ambulatory Visit (HOSPITAL_COMMUNITY): Payer: Self-pay | Admitting: Psychiatry

## 2012-10-31 ENCOUNTER — Encounter (HOSPITAL_COMMUNITY): Payer: Self-pay | Admitting: Psychiatry

## 2012-10-31 ENCOUNTER — Ambulatory Visit (INDEPENDENT_AMBULATORY_CARE_PROVIDER_SITE_OTHER): Payer: Medicare Other | Admitting: Psychiatry

## 2012-10-31 ENCOUNTER — Ambulatory Visit (INDEPENDENT_AMBULATORY_CARE_PROVIDER_SITE_OTHER): Payer: Medicare Other | Admitting: Family Medicine

## 2012-10-31 ENCOUNTER — Encounter: Payer: Self-pay | Admitting: Family Medicine

## 2012-10-31 VITALS — BP 122/80 | Ht 71.5 in | Wt 165.0 lb

## 2012-10-31 VITALS — BP 148/98 | HR 88 | Temp 98.2°F | Wt 163.0 lb

## 2012-10-31 DIAGNOSIS — A499 Bacterial infection, unspecified: Secondary | ICD-10-CM

## 2012-10-31 DIAGNOSIS — F329 Major depressive disorder, single episode, unspecified: Secondary | ICD-10-CM

## 2012-10-31 DIAGNOSIS — J329 Chronic sinusitis, unspecified: Secondary | ICD-10-CM

## 2012-10-31 DIAGNOSIS — B9689 Other specified bacterial agents as the cause of diseases classified elsewhere: Secondary | ICD-10-CM

## 2012-10-31 DIAGNOSIS — Z23 Encounter for immunization: Secondary | ICD-10-CM

## 2012-10-31 DIAGNOSIS — R1115 Cyclical vomiting syndrome unrelated to migraine: Secondary | ICD-10-CM

## 2012-10-31 DIAGNOSIS — F411 Generalized anxiety disorder: Secondary | ICD-10-CM

## 2012-10-31 DIAGNOSIS — F331 Major depressive disorder, recurrent, moderate: Secondary | ICD-10-CM

## 2012-10-31 MED ORDER — AMOXICILLIN-POT CLAVULANATE 500-125 MG PO TABS: ORAL_TABLET | ORAL | Status: AC

## 2012-10-31 NOTE — Progress Notes (Signed)
CC: Jordan Herrera is a 47 y.o. male is here for Sinusitis   Subjective: HPI:  Patient complains of facial pain localized between the eyes and above the eyes that has been present for 10 days worsening on a daily basis gradual onset gradual worsening. Associated with subjective fevers the past 2 days nasal congestion and sensation of postnasal drip. No interventions as of yet. Symptoms are worse with lying on his back nothing else makes better or worse. Denies motor sensory disturbances, shortness of breath, wheezing, chest pain, abdominal pain, dizziness or confusion    Review Of Systems Outlined In HPI  Past Medical History  Diagnosis Date  . Depression   . Anxiety   . Eating disorder   . PUD (peptic ulcer disease)     rupture  . Unspecified viral hepatitis C without hepatic coma      Family History  Problem Relation Age of Onset  . Adopted: Yes     History  Substance Use Topics  . Smoking status: Never Smoker   . Smokeless tobacco: Current User    Types: Snuff  . Alcohol Use: No     Comment: THC, history cocaine use but not IVDU     Objective: Filed Vitals:   10/31/12 1055  BP: 148/98  Pulse: 88  Temp: 98.2 F (36.8 C)    General: Alert and Oriented, No Acute Distress HEENT: Pupils equal, round, reactive to light. Conjunctivae clear.  External ears unremarkable, canals clear with intact TMs with appropriate landmarks.  Middle ear appears open without effusion. Boggy inferior turbinates.  Moist mucous membranes, pharynx without inflammation nor lesions.  Neck supple without palpable lymphadenopathy nor abnormal masses. Frontal sinus tenderness to percussion Lungs: Clear to auscultation bilaterally, no wheezing/ronchi/rales.  Comfortable work of breathing. Good air movement. Cardiac: Regular rate and rhythm. Normal S1/S2.  No murmurs, rubs, nor gallops.   Mental Status: No depression, anxiety, nor agitation. Skin: Warm and dry.  Assessment & Plan: Ori was  seen today for sinusitis.  Diagnoses and associated orders for this visit:  Need for prophylactic vaccination and inoculation against influenza  Bacterial sinusitis - amoxicillin-clavulanate (AUGMENTIN) 500-125 MG per tablet; Take one by mouth every 8 hours for ten total days.    Bacterial sinusitis: Start Augmentin call if no improvement by Friday consider  Alka-Seltzer cold and sinus for symptom control  Return if symptoms worsen or fail to improve.

## 2012-10-31 NOTE — Progress Notes (Signed)
[facility] Follow-up Outpatient Visit  Jordan Herrera 1965-04-04  Date: 01/13/11   Subjective:  The patient is a 47 year old male who has been seen here by myself at [facility] since January 2010.  He is currently diagnosed with generalized anxiety disorder,  depression and a cyclic vomiting disorder. At his last appointment, I did not make any changes. He presents today. He still does not have his driver's license. He has been told now that it will be approximately September 23. The patient is down another 10 pounds today. He has no explanation. He reports that everyday stuff is building up. His anxiety is not out of control, but he has been more irritable. He reports yelling at his son. He is concerned because his son does not feel the need to improve. 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 They were concerned about dad's record, but he has not had any offenses since 2007. 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 Mom was having him do a special program her math which he eventually failed. The patient is worried that his son will not live up to his full potential. I reframed the situation and showed him how he was putting more pressure on his son since the patient's entire life revolves around his son. The patient is to find more of his own to do. He is to allow his son to be a 10 year old. The patient feels that his medications are working. He endorses good sleep. His appetite has been down. There has been no vomiting. There has been some issues with him and his son's mother. Filed Vitals:   10/31/12 1016  BP: 122/80   Active Ambulatory Problems    Diagnosis Date Noted  . GAD (generalized anxiety disorder) 12/22/2007  . GERD, SEVERE 12/10/2009  . PEPTIC ULCER DISEASE 12/22/2007  . SHOULDER PAIN, LEFT 02/22/2008  . HAND PAIN, RIGHT 12/18/2008  . LOSS OF WEIGHT 12/22/2007  . 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  Penile wart 08/12/2010  . Mood disorder 02/10/2011  . Osteoarthritis of left hip 06/05/2012   Resolved Ambulatory Problems    Diagnosis Date Noted  . Wheezing 02/22/2008  . FLANK PAIN, RIGHT 12/10/2009   Past Medical History  Diagnosis Date  . Depression   . Anxiety   . Eating disorder   . PUD (peptic ulcer disease)   . Unspecified viral hepatitis C without hepatic coma    Current Outpatient Prescriptions on File Prior to Visit  Medication Sig Dispense Refill  . ALPRAZolam (XANAX) 1 MG tablet TAKE 1 TABLET BY MOUTH TWICE A DAY AS NEEDED  60 tablet  5  . buPROPion (WELLBUTRIN XL) 300 MG 24 hr tablet TAKE 1 TABLET (300 MG TOTAL) BY MOUTH DAILY.  30 tablet  2  . imiquimod (ALDARA) 5 % cream APPLY TOPICALLY 3 X A WEEK UNTIL CLEAR OR X 16 WEEKS WICHEVER COMES FIRST  12 each  1  . meloxicam (MOBIC) 15 MG tablet ONE TAB BY MOUTH AMWITH BREAKFAST FOR 2 WEEKS, THEN DAILY AS NEEDED FOR PAIN PAIN.  30 tablet  3  . omeprazole (PRILOSEC) 40 MG capsule       . promethazine (PHENERGAN) 25 MG tablet       . QUEtiapine (SEROQUEL) 25 MG tablet TAKE 1 TABLET (25 MG TOTAL) BY MOUTH AT BEDTIME.  30 tablet  5  . QUEtiapine (SEROQUEL) 25 MG tablet TAKE 1 TABLET (25  MG TOTAL) BY MOUTH AT BEDTIME.  30 tablet  5   No current facility-administered medications on file prior to visit.   Review of Systems - General ROS: negative for - sleep disturbance or weight gain Psychological ROS: negative for - anxiety or depression Cardiovascular ROS: no chest pain or dyspnea on exertion Musculoskeletal ROS: negative for - gait disturbance or muscular weakness Neurological ROS: negative for - headaches or seizures  Mental Status Examination  Appearance: Casually dressed  Alert: Yes Attention: good  Cooperative: Yes Eye Contact: Good Speech: Regular rate rhythm and volume Psychomotor Activity: Normal Memory/Concentration: Intact to recent and remote Oriented: person, place, time/date and situation Mood:  Euphoric Affect: Full Range Thought Processes and Associations: Logical Fund of Knowledge: Fair Thought Content: Patient denies any suicidal or homicidal thoughts Insight: Fair Judgement: Fair  Diagnosis: Generalized anxiety disorder, depression and cyclic vomiting  Treatment Plan: At this point I will not change his medications. I will continue him on the Seroquel, Wellbutrin XL, and Xanax. I'll see the patient back in 2 months. Patient may call with concerns. Jamse Mead, MD

## 2012-11-29 ENCOUNTER — Other Ambulatory Visit (HOSPITAL_COMMUNITY): Payer: Self-pay | Admitting: Psychiatry

## 2012-12-01 ENCOUNTER — Other Ambulatory Visit (HOSPITAL_COMMUNITY): Payer: Self-pay | Admitting: Psychiatry

## 2013-01-01 ENCOUNTER — Ambulatory Visit (HOSPITAL_COMMUNITY): Payer: Self-pay | Admitting: Psychiatry

## 2013-01-29 ENCOUNTER — Ambulatory Visit (HOSPITAL_COMMUNITY): Payer: Self-pay | Admitting: Psychiatry

## 2013-02-19 ENCOUNTER — Other Ambulatory Visit (HOSPITAL_COMMUNITY): Payer: Self-pay | Admitting: Psychiatry

## 2013-05-15 ENCOUNTER — Ambulatory Visit: Payer: Self-pay | Admitting: Family Medicine

## 2013-05-15 DIAGNOSIS — Z0289 Encounter for other administrative examinations: Secondary | ICD-10-CM

## 2013-07-21 ENCOUNTER — Emergency Department (HOSPITAL_BASED_OUTPATIENT_CLINIC_OR_DEPARTMENT_OTHER): Payer: Medicare Other

## 2013-07-21 ENCOUNTER — Encounter (HOSPITAL_BASED_OUTPATIENT_CLINIC_OR_DEPARTMENT_OTHER): Payer: Self-pay | Admitting: Emergency Medicine

## 2013-07-21 ENCOUNTER — Emergency Department (HOSPITAL_BASED_OUTPATIENT_CLINIC_OR_DEPARTMENT_OTHER)
Admission: EM | Admit: 2013-07-21 | Discharge: 2013-07-21 | Disposition: A | Discharge status: Home / Self Care | Payer: Medicare Other | Attending: Emergency Medicine | Admitting: Emergency Medicine

## 2013-07-21 DIAGNOSIS — M79673 Pain in unspecified foot: Secondary | ICD-10-CM

## 2013-07-21 DIAGNOSIS — F3289 Other specified depressive episodes: Secondary | ICD-10-CM | POA: Insufficient documentation

## 2013-07-21 DIAGNOSIS — F411 Generalized anxiety disorder: Secondary | ICD-10-CM | POA: Insufficient documentation

## 2013-07-21 DIAGNOSIS — IMO0002 Reserved for concepts with insufficient information to code with codable children: Secondary | ICD-10-CM | POA: Insufficient documentation

## 2013-07-21 DIAGNOSIS — Y9389 Activity, other specified: Secondary | ICD-10-CM | POA: Insufficient documentation

## 2013-07-21 DIAGNOSIS — F329 Major depressive disorder, single episode, unspecified: Secondary | ICD-10-CM | POA: Insufficient documentation

## 2013-07-21 DIAGNOSIS — Z79899 Other long term (current) drug therapy: Secondary | ICD-10-CM | POA: Insufficient documentation

## 2013-07-21 DIAGNOSIS — Z8711 Personal history of peptic ulcer disease: Secondary | ICD-10-CM | POA: Insufficient documentation

## 2013-07-21 DIAGNOSIS — Z8619 Personal history of other infectious and parasitic diseases: Secondary | ICD-10-CM | POA: Insufficient documentation

## 2013-07-21 DIAGNOSIS — Y929 Unspecified place or not applicable: Secondary | ICD-10-CM | POA: Insufficient documentation

## 2013-07-21 DIAGNOSIS — W268XXA Contact with other sharp object(s), not elsewhere classified, initial encounter: Secondary | ICD-10-CM | POA: Insufficient documentation

## 2013-07-21 MED ORDER — LIDOCAINE HCL 2 % IJ SOLN
INTRAMUSCULAR | Status: AC
  Administered 2013-07-21: 18:00:00
  Filled 2013-07-21: qty 20

## 2013-07-21 NOTE — ED Provider Notes (Signed)
CSN: [id number]     Arrival date & time 07/21/13  1635 History   First MD Initiated Contact with Patient 07/21/13 1700     Chief Complaint  Patient presents with  . Foot Pain     (Consider location/radiation/quality/duration/timing/severity/associated sxs/prior Treatment) Patient is a 48 y.o. male presenting with foreign body. The history is provided by the patient. No language interpreter was used.  Foreign Body Location:  Skin Suspected object:  Glass Pain quality:  Aching Pain severity:  Moderate Timing:  Constant Progression:  Worsening Chronicity:  New Pt reports he thinks a piece of class is working it's way out of his foot.    Past Medical History  Diagnosis Date  . Depression   . Anxiety   . Eating disorder   . PUD (peptic ulcer disease)     rupture  . Unspecified viral hepatitis C without hepatic coma    Past Surgical History  Procedure Laterality Date  . Ruptured gastric ulcer  1998  . Rt hand surgery  1995   Family History  Problem Relation Age of Onset  . Adopted: Yes   History  Substance Use Topics  . Smoking status: Never Smoker   . Smokeless tobacco: Current User    Types: Snuff  . Alcohol Use: No     Comment: THC, history cocaine use but not IVDU    Review of Systems  Skin: Positive for wound.  All other systems reviewed and are negative.     Allergies  Review of patient's allergies indicates no known allergies.  Home Medications   Prior to Admission medications   Medication Sig Start Date End Date Taking? Authorizing Provider  ALPRAZolam Prudy Feeler(XANAX) 1 MG tablet TAKE 1 TABLET BY MOUTH TWICE A DAY AS NEEDED 12/01/12   Jamse MeadMary Patricia Moore, MD  buPROPion (WELLBUTRIN XL) 300 MG 24 hr tablet TAKE 1 TABLET (300 MG TOTAL) BY MOUTH DAILY. 11/29/12   Jamse MeadMary Patricia Moore, MD  imiquimod (ALDARA) 5 % cream APPLY TOPICALLY 3 X A WEEK UNTIL CLEAR OR X 16 WEEKS WICHEVER COMES FIRST 10/06/12   Agapito Gamesatherine D Metheney, MD  meloxicam (MOBIC) 15 MG tablet ONE TAB BY  MOUTH AMWITH BREAKFAST FOR 2 WEEKS, THEN DAILY AS NEEDED FOR PAIN PAIN. 10/06/12   Monica Bectonhomas J Thekkekandam, MD  omeprazole (PRILOSEC) 40 MG capsule  10/04/11   Historical Provider, MD  promethazine (PHENERGAN) 25 MG tablet  09/08/11   Historical Provider, MD  QUEtiapine (SEROQUEL) 25 MG tablet TAKE 1 TABLET (25 MG TOTAL) BY MOUTH AT BEDTIME. 10/06/12   Jamse MeadMary Patricia Moore, MD   BP 155/98  Pulse 104  Temp(Src) 98.9 F (37.2 C) (Oral)  Resp 20  Ht 6' (1.829 m)  Wt 180 lb (81.647 kg)  BMI 24.41 kg/m2  SpO2 97% Physical Exam  Nursing note and vitals reviewed. Constitutional: He is oriented to person, place, and time. He appears well-developed and well-nourished.  Musculoskeletal: He exhibits tenderness.  3rd toe,  Abrasion to side of toe,  Pt has foreign body senstaion when I touch  Neurological: He is alert and oriented to person, place, and time.  Skin: Skin is warm.  Psychiatric: He has a normal mood and affect.    ED Course  FOREIGN BODY REMOVAL Date/Time: 07/21/2013 7:46 PM Performed by: Elson AreasSOFIA, Aleisha Paone K Authorized by: Elson AreasSOFIA, Jamal Pavon K Consent: Verbal consent obtained. Patient understanding: patient states understanding of the procedure being performed Patient consent: the patient's understanding of the procedure matches consent given Patient identity confirmed: verbally with  patient Body area: skin Anesthesia: local infiltration Local anesthetic: lidocaine 2% without epinephrine Patient sedated: no Complexity: simple Comments: Palpable lump   I removed with forcep,   Small skin flap removed with 11 blade,  (may have small foreign body)     (including critical care time) Labs Review Labs Reviewed - No data to display  Imaging Review No results found.   EKG Interpretation None      MDM I counseled pt on xray report.   I advised if he continues to have foreign body sensation folllow up with Dr. Shelle IronBeane for evaluation   Final diagnoses:  Foot pain    Wound care    Elson AreasLeslie  K Omauri Boeve, PA-C 07/21/13 1949

## 2013-07-21 NOTE — Discharge Instructions (Signed)
Sliver Removal °You have had a sliver (splinter) removed. This has caused a wound that extends through some or all layers of the skin and possibly into the subcutaneous tissue. This is the tissue just beneath the skin. Because these wounds can not be cleaned well, it is necessary to watch closely for infection. °AFTER THE PROCEDURE  °If a cut (incision) was necessary to remove this, it may have been repaired for you by your caregiver either with suturing, stapling, or adhesive strips. These keep together the skin edges and allow better and faster healing. °HOME CARE INSTRUCTIONS  °· A dressing may have been applied. This may be changed once per day or as instructed. If the dressing sticks, it may be soaked off with a gauze pad or clean cloth that has been dampened with soapy water or hydrogen peroxide. °· It is difficult to remove all slivers or foreign bodies as they may break or splinter into smaller pieces. Be aware that your body will work to remove the foreign substance. That is, the foreign body may work itself out of the wound. That is normal. °· Watch for signs of infection and notify your caregiver if you suspect a sliver or foreign body remains in the wound. °· You may have received a recommendation to follow up with your physician or a specialist. It is very important to call for or keep follow-up appointments in order to avoid infection or other complications. °· Only take over-the-counter or prescription medicines for pain, discomfort, or fever as directed by your caregiver. °· If antibiotics were prescribed, be sure to finish all of the medicine. °If you did not receive a tetanus shot today because you did not recall when your last one was given, check with your caregiver in the next day or two during follow up to determine if one is needed. °SEEK MEDICAL CARE IF:  °· The area around the wound has new or worsening redness or tenderness. °· Pus is coming from the wound °· There is a foul smell from the  wound or dressing °· The edges of a wound that had been repaired break open °SEEK IMMEDIATE MEDICAL CARE IF:  °· Red streaks are coming from the wound °· An unexplained oral temperature above 102° F (38.9° C) develops. °Document Released: 02/20/2000 Document Revised: 05/17/2011 Document Reviewed: 10/09/2007 °[employer]® Patient Information ©2014 [employer]. ° °

## 2013-07-21 NOTE — ED Notes (Signed)
Pt c/o right 3rd toe pain.  States there is a knot where he believes a piece of glass is stuck.  PCP lanced the spot about a year ago and pt states received some relief but that in the past week it has been bothering him more and feels like it "is pushing its way out."  Small blister noted on right 3rd toe upon assessment.

## 2013-07-21 NOTE — ED Notes (Signed)
Reports glass being in his right foot for one year, attempted removal by a physician one year ago.  Reports he 'wants the rest of the glass out' today because he is tired of dealing with it.  States its basically a knot under his skin, causing a sore.

## 2013-07-22 NOTE — ED Provider Notes (Signed)
Medical screening examination/treatment/procedure(s) were performed by non-physician practitioner and as supervising physician I was immediately available for consultation/collaboration.   EKG Interpretation None       Hurman HornJohn M Kevonta Phariss, MD 07/22/13 [phone]

## 2014-02-02 ENCOUNTER — Encounter (HOSPITAL_BASED_OUTPATIENT_CLINIC_OR_DEPARTMENT_OTHER): Payer: Self-pay | Admitting: *Deleted

## 2014-02-02 ENCOUNTER — Emergency Department (HOSPITAL_BASED_OUTPATIENT_CLINIC_OR_DEPARTMENT_OTHER): Payer: Medicare Other

## 2014-02-02 ENCOUNTER — Emergency Department (HOSPITAL_BASED_OUTPATIENT_CLINIC_OR_DEPARTMENT_OTHER)
Admission: EM | Admit: 2014-02-02 | Discharge: 2014-02-02 | Disposition: A | Discharge status: Home / Self Care | Payer: Medicare Other | Attending: Emergency Medicine | Admitting: Emergency Medicine

## 2014-02-02 DIAGNOSIS — S6991XA Unspecified injury of right wrist, hand and finger(s), initial encounter: Secondary | ICD-10-CM | POA: Diagnosis present

## 2014-02-02 DIAGNOSIS — F329 Major depressive disorder, single episode, unspecified: Secondary | ICD-10-CM | POA: Diagnosis not present

## 2014-02-02 DIAGNOSIS — S62614A Displaced fracture of proximal phalanx of right ring finger, initial encounter for closed fracture: Secondary | ICD-10-CM | POA: Insufficient documentation

## 2014-02-02 DIAGNOSIS — X58XXXA Exposure to other specified factors, initial encounter: Secondary | ICD-10-CM | POA: Diagnosis not present

## 2014-02-02 DIAGNOSIS — Y998 Other external cause status: Secondary | ICD-10-CM | POA: Diagnosis not present

## 2014-02-02 DIAGNOSIS — Y9289 Other specified places as the place of occurrence of the external cause: Secondary | ICD-10-CM | POA: Insufficient documentation

## 2014-02-02 DIAGNOSIS — Z8619 Personal history of other infectious and parasitic diseases: Secondary | ICD-10-CM | POA: Diagnosis not present

## 2014-02-02 DIAGNOSIS — Z79899 Other long term (current) drug therapy: Secondary | ICD-10-CM | POA: Diagnosis not present

## 2014-02-02 DIAGNOSIS — F419 Anxiety disorder, unspecified: Secondary | ICD-10-CM | POA: Insufficient documentation

## 2014-02-02 DIAGNOSIS — Y9389 Activity, other specified: Secondary | ICD-10-CM | POA: Diagnosis not present

## 2014-02-02 DIAGNOSIS — S62619A Displaced fracture of proximal phalanx of unspecified finger, initial encounter for closed fracture: Secondary | ICD-10-CM

## 2014-02-02 NOTE — ED Notes (Signed)
Patient c/o R hand ring finger pain, after he broke up a fight between dogs this morning

## 2014-02-02 NOTE — Discharge Instructions (Signed)

## 2014-02-02 NOTE — ED Provider Notes (Signed)
CSN: [id number]     Arrival date & time 02/02/14  [id number] History   First MD Initiated Contact with Patient 02/02/14 0830     Chief Complaint  Patient presents with  . Hand Pain     (Consider location/radiation/quality/duration/timing/severity/associated sxs/prior Treatment) HPI Comments: Patient presents with pain to his right ring finger. He was breaking up a fight between his dogs and one of his fingers caught caught and pulled back his ring finger. He felt like it was deformed and that it might have popped out of place. He pulled on it and felt it pop back into place. He's had prior significant injury with surgery to that right hand. 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 He has pain today at the base of the ring finger at the MCP joint.  Patient is a 48 y.o. male presenting with hand pain.  Hand Pain Pertinent negatives include no headaches.    Past Medical History  Diagnosis Date  . Depression   . Anxiety   . Eating disorder   . PUD (peptic ulcer disease)     rupture  . Unspecified viral hepatitis C without hepatic coma    Past Surgical History  Procedure Laterality Date  . Ruptured gastric ulcer  1998  . Rt hand surgery  1995   Family History  Problem Relation Age of Onset  . Adopted: Yes   History  Substance Use Topics  . Smoking status: Never Smoker   . Smokeless tobacco: Current User    Types: Snuff  . Alcohol Use: No     Comment: THC, history cocaine use but not IVDU    Review of Systems  Constitutional: Negative for fever.  Gastrointestinal: Negative for nausea and vomiting.  Musculoskeletal: Positive for joint swelling and arthralgias. Negative for back pain and neck pain.  Skin: Negative for wound.  Neurological: Negative for weakness, numbness and headaches.      Allergies  Review  of patient's allergies indicates no known allergies.  Home Medications   Prior to Admission medications   Medication Sig Start Date End Date Taking? Authorizing Provider  ALPRAZolam Prudy Feeler(XANAX) 1 MG tablet TAKE 1 TABLET BY MOUTH TWICE A DAY AS NEEDED 12/01/12   Jamse MeadMary Patricia Moore, MD  buPROPion (WELLBUTRIN XL) 300 MG 24 hr tablet TAKE 1 TABLET (300 MG TOTAL) BY MOUTH DAILY. 11/29/12   Jamse MeadMary Patricia Moore, MD  imiquimod (ALDARA) 5 % cream APPLY TOPICALLY 3 X A WEEK UNTIL CLEAR OR X 16 WEEKS WICHEVER COMES FIRST 10/06/12   Agapito Gamesatherine D Metheney, MD  meloxicam (MOBIC) 15 MG tablet ONE TAB BY MOUTH AMWITH BREAKFAST FOR 2 WEEKS, THEN DAILY AS NEEDED FOR PAIN PAIN. 10/06/12   Monica Bectonhomas J Thekkekandam, MD  omeprazole (PRILOSEC) 40 MG capsule  10/04/11   Historical Provider, MD  promethazine (PHENERGAN) 25 MG tablet  09/08/11   Historical Provider, MD  QUEtiapine (SEROQUEL) 25 MG tablet TAKE 1 TABLET (25 MG TOTAL) BY MOUTH AT BEDTIME. 10/06/12   Jamse MeadMary Patricia Moore, MD   BP 126/80 mmHg  Pulse 77  Temp(Src) 98 F (36.7 C) (Oral)  Resp 20  Ht 6' (1.829 m)  Wt 175 lb (79.379 kg)  BMI 23.73 kg/m2  SpO2 100% Physical Exam  Constitutional: He is oriented to person, place, and time. He appears well-developed and well-nourished.  HENT:  Head: Normocephalic and atraumatic.  Neck: Normal range of motion. Neck supple.  Cardiovascular: Normal rate.   Pulmonary/Chest: Effort normal.  Musculoskeletal: He exhibits edema and tenderness.  Patient has tenderness to the right fourth MCP joint. There is mild swelling to this area. He has some numbness to light touch distally in the fourth and fifth fingers which is chronic and unchanged from his baseline. He has limited flexion of the ring finger as he cannot do isolated flexion of the DIP joint. This is chronic and unchanged for him. Capillary refill is less than 2. He has no other bony tenderness to the hand. He has no wounds.  Neurological: He is alert and oriented to person,  place, and time.  Skin: Skin is warm and dry.  Psychiatric: He has a normal mood and affect.    ED Course  Procedures (including critical care time) Labs Review Labs Reviewed - No data to display  Imaging Review Dg Hand Complete Right  02/02/2014   CLINICAL DATA:  Right hand injury with pain in the right ring finger.  EXAM: RIGHT HAND - COMPLETE 3+ VIEW  COMPARISON:  12/18/2008  FINDINGS: Displaced fracture involving the proximal phalanx of the ring finger. This fracture is along the proximal aspect of the phalanx and does not clearly involve the MCP joint. Again noted is mild angulation of the fifth metacarpal bone and suspect an old injury at this location. No other acute fractures.  IMPRESSION: Displaced fracture of the ring finger proximal phalanx.   Electronically Signed   By: Richarda OverlieAdam  Henn M.D.   On: 02/02/2014 09:11     EKG Interpretation None      MDM   Final diagnoses:  Hand injury, right, initial encounter  Proximal phalanx fracture of finger, closed, initial encounter    Discussed with Dr. Merlyn LotKuzma who reviewed the x-rays.  Pt was placed in an ulnar gutter splint and will f/u this week with Dr. Merlyn LotKuzma.  He denies the need for pain medications    Rolan BuccoMelanie Akshitha Culmer, MD 02/02/14 [phone]

## 2014-02-06 ENCOUNTER — Other Ambulatory Visit: Payer: Self-pay | Admitting: Orthopedic Surgery

## 2014-02-06 ENCOUNTER — Encounter (HOSPITAL_BASED_OUTPATIENT_CLINIC_OR_DEPARTMENT_OTHER): Payer: Self-pay | Admitting: *Deleted

## 2014-02-06 NOTE — Progress Notes (Signed)
No labs needed

## 2014-02-07 ENCOUNTER — Ambulatory Visit (HOSPITAL_BASED_OUTPATIENT_CLINIC_OR_DEPARTMENT_OTHER): Payer: Medicare Other | Admitting: Anesthesiology

## 2014-02-07 ENCOUNTER — Encounter (HOSPITAL_BASED_OUTPATIENT_CLINIC_OR_DEPARTMENT_OTHER): Payer: Self-pay | Admitting: *Deleted

## 2014-02-07 ENCOUNTER — Encounter (HOSPITAL_BASED_OUTPATIENT_CLINIC_OR_DEPARTMENT_OTHER)
Admission: RE | Disposition: A | Discharge status: Home / Self Care | Payer: Self-pay | Source: Ambulatory Visit | Attending: Orthopedic Surgery

## 2014-02-07 ENCOUNTER — Ambulatory Visit (HOSPITAL_BASED_OUTPATIENT_CLINIC_OR_DEPARTMENT_OTHER)
Admission: RE | Admit: 2014-02-07 | Discharge: 2014-02-07 | Disposition: A | Discharge status: Home / Self Care | Payer: Medicare Other | Source: Ambulatory Visit | Attending: Orthopedic Surgery | Admitting: Orthopedic Surgery

## 2014-02-07 DIAGNOSIS — Z79899 Other long term (current) drug therapy: Secondary | ICD-10-CM | POA: Insufficient documentation

## 2014-02-07 DIAGNOSIS — S62614A Displaced fracture of proximal phalanx of right ring finger, initial encounter for closed fracture: Secondary | ICD-10-CM | POA: Diagnosis present

## 2014-02-07 DIAGNOSIS — Y929 Unspecified place or not applicable: Secondary | ICD-10-CM | POA: Insufficient documentation

## 2014-02-07 DIAGNOSIS — K279 Peptic ulcer, site unspecified, unspecified as acute or chronic, without hemorrhage or perforation: Secondary | ICD-10-CM | POA: Insufficient documentation

## 2014-02-07 DIAGNOSIS — F319 Bipolar disorder, unspecified: Secondary | ICD-10-CM | POA: Insufficient documentation

## 2014-02-07 DIAGNOSIS — X58XXXA Exposure to other specified factors, initial encounter: Secondary | ICD-10-CM | POA: Insufficient documentation

## 2014-02-07 HISTORY — DX: Myoneural disorder, unspecified: G70.9

## 2014-02-07 HISTORY — DX: Bipolar disorder, unspecified: F31.9

## 2014-02-07 HISTORY — PX: CLOSED REDUCTION METACARPAL WITH PERCUTANEOUS PINNING: SHX5613

## 2014-02-07 LAB — POCT HEMOGLOBIN-HEMACUE: Hemoglobin: 14 g/dL (ref 13.0–17.0)

## 2014-02-07 SURGERY — CLOSED REDUCTION, FRACTURE, METACARPAL BONE, WITH PERCUTANEOUS PINNING
Anesthesia: General | Site: Hand | Laterality: Right

## 2014-02-07 MED ORDER — MIDAZOLAM HCL 2 MG/2ML IJ SOLN
INTRAMUSCULAR | Status: AC
  Filled 2014-02-07: qty 2

## 2014-02-07 MED ORDER — FENTANYL CITRATE 0.05 MG/ML IJ SOLN
INTRAMUSCULAR | Status: AC
Start: 2014-02-07 — End: 2014-02-07
  Filled 2014-02-07: qty 4

## 2014-02-07 MED ORDER — PROMETHAZINE HCL 25 MG/ML IJ SOLN
6.2500 mg | INTRAMUSCULAR | Status: DC | PRN
Start: 2014-02-07 — End: 2014-02-07

## 2014-02-07 MED ORDER — FENTANYL CITRATE 0.05 MG/ML IJ SOLN: 50.0000 ug | INTRAMUSCULAR | Status: DC | PRN

## 2014-02-07 MED ORDER — CHLORHEXIDINE GLUCONATE 4 % EX LIQD: 60.0000 mL | Freq: Once | CUTANEOUS | Status: DC

## 2014-02-07 MED ORDER — CEFAZOLIN SODIUM-DEXTROSE 2-3 GM-% IV SOLR
2.0000 g | INTRAVENOUS | Status: AC
  Administered 2014-02-07: 2 g via INTRAVENOUS

## 2014-02-07 MED ORDER — HYDROMORPHONE HCL 1 MG/ML IJ SOLN: 0.2500 mg | INTRAMUSCULAR | Status: DC | PRN

## 2014-02-07 MED ORDER — PROPOFOL 10 MG/ML IV BOLUS
INTRAVENOUS | Status: DC | PRN
  Administered 2014-02-07: 200 mg via INTRAVENOUS
  Administered 2014-02-07: 50 mg via INTRAVENOUS

## 2014-02-07 MED ORDER — FENTANYL CITRATE 0.05 MG/ML IJ SOLN
INTRAMUSCULAR | Status: AC
  Filled 2014-02-07: qty 4

## 2014-02-07 MED ORDER — MIDAZOLAM HCL 2 MG/2ML IJ SOLN: 1.0000 mg | INTRAMUSCULAR | Status: DC | PRN

## 2014-02-07 MED ORDER — OXYCODONE HCL 5 MG PO TABS: 5.0000 mg | ORAL_TABLET | Freq: Once | ORAL | Status: DC | PRN

## 2014-02-07 MED ORDER — OXYCODONE HCL 5 MG/5ML PO SOLN: 5.0000 mg | Freq: Once | ORAL | Status: DC | PRN

## 2014-02-07 MED ORDER — CEFAZOLIN SODIUM-DEXTROSE 2-3 GM-% IV SOLR
INTRAVENOUS | Status: AC
  Filled 2014-02-07: qty 50

## 2014-02-07 MED ORDER — FENTANYL CITRATE 0.05 MG/ML IJ SOLN
INTRAMUSCULAR | Status: DC | PRN
  Administered 2014-02-07: 100 ug via INTRAVENOUS

## 2014-02-07 MED ORDER — ONDANSETRON HCL 4 MG/2ML IJ SOLN
INTRAMUSCULAR | Status: DC | PRN
  Administered 2014-02-07: 4 mg via INTRAVENOUS

## 2014-02-07 MED ORDER — DEXAMETHASONE SODIUM PHOSPHATE 10 MG/ML IJ SOLN
INTRAMUSCULAR | Status: DC | PRN
  Administered 2014-02-07: 8 mg via INTRAVENOUS

## 2014-02-07 MED ORDER — BUPIVACAINE HCL (PF) 0.25 % IJ SOLN
INTRAMUSCULAR | Status: DC | PRN
  Administered 2014-02-07: 9 mL

## 2014-02-07 MED ORDER — MIDAZOLAM HCL 5 MG/5ML IJ SOLN
INTRAMUSCULAR | Status: DC | PRN
  Administered 2014-02-07: 2 mg via INTRAVENOUS

## 2014-02-07 MED ORDER — LIDOCAINE HCL (CARDIAC) 20 MG/ML IV SOLN
INTRAVENOUS | Status: DC | PRN
  Administered 2014-02-07: 80 mg via INTRAVENOUS

## 2014-02-07 MED ORDER — LACTATED RINGERS IV SOLN
INTRAVENOUS | Status: DC
  Administered 2014-02-07: 09:00:00 via INTRAVENOUS

## 2014-02-07 MED ORDER — OXYCODONE-ACETAMINOPHEN 5-325 MG PO TABS: ORAL_TABLET | ORAL | Status: AC

## 2014-02-07 SURGICAL SUPPLY — 58 items
BANDAGE ELASTIC 3 VELCRO ST LF (GAUZE/BANDAGES/DRESSINGS) IMPLANT
BLADE MINI RND TIP GREEN BEAV (BLADE) IMPLANT
BLADE SURG 15 STRL LF DISP TIS (BLADE) ×4 IMPLANT
BLADE SURG 15 STRL SS (BLADE) ×8
BNDG CMPR 9X4 STRL LF SNTH (GAUZE/BANDAGES/DRESSINGS)
BNDG CMPR MD 5X2 ELC HKLP STRL (GAUZE/BANDAGES/DRESSINGS) ×2
BNDG ELASTIC 2 VLCR STRL LF (GAUZE/BANDAGES/DRESSINGS) ×3 IMPLANT
BNDG ESMARK 4X9 LF (GAUZE/BANDAGES/DRESSINGS) IMPLANT
BNDG GAUZE ELAST 4 BULKY (GAUZE/BANDAGES/DRESSINGS) ×4 IMPLANT
CHLORAPREP W/TINT 26ML (MISCELLANEOUS) ×4 IMPLANT
CORDS BIPOLAR (ELECTRODE) ×4 IMPLANT
COVER BACK TABLE 60X90IN (DRAPES) ×4 IMPLANT
COVER MAYO STAND STRL (DRAPES) ×4 IMPLANT
CUFF TOURNIQUET SINGLE 18IN (TOURNIQUET CUFF) ×4 IMPLANT
DRAPE EXTREMITY T 121X128X90 (DRAPE) ×4 IMPLANT
DRAPE OEC MINIVIEW 54X84 (DRAPES) ×3 IMPLANT
DRAPE SURG 17X23 STRL (DRAPES) ×4 IMPLANT
GAUZE SPONGE 4X4 12PLY STRL (GAUZE/BANDAGES/DRESSINGS) ×4 IMPLANT
GAUZE XEROFORM 1X8 LF (GAUZE/BANDAGES/DRESSINGS) ×4 IMPLANT
GLOVE BIO SURGEON STRL SZ7.5 (GLOVE) ×4 IMPLANT
GLOVE BIOGEL PI IND STRL 7.5 (GLOVE) ×1 IMPLANT
GLOVE BIOGEL PI IND STRL 8 (GLOVE) ×2 IMPLANT
GLOVE BIOGEL PI IND STRL 8.5 (GLOVE) ×1 IMPLANT
GLOVE BIOGEL PI INDICATOR 7.5 (GLOVE) ×2
GLOVE BIOGEL PI INDICATOR 8 (GLOVE) ×2
GLOVE BIOGEL PI INDICATOR 8.5 (GLOVE) ×2
GLOVE SURG ORTHO 8.0 STRL STRW (GLOVE) IMPLANT
GLOVE SURG SS PI 7.5 STRL IVOR (GLOVE) ×3 IMPLANT
GOWN STRL REUS W/ TWL LRG LVL3 (GOWN DISPOSABLE) ×2 IMPLANT
GOWN STRL REUS W/ TWL XL LVL3 (GOWN DISPOSABLE) ×1 IMPLANT
GOWN STRL REUS W/TWL LRG LVL3 (GOWN DISPOSABLE) ×4
GOWN STRL REUS W/TWL XL LVL3 (GOWN DISPOSABLE) ×4 IMPLANT
K-WIRE .035X4 (WIRE) ×6 IMPLANT
NDL HYPO 25X1 1.5 SAFETY (NEEDLE) IMPLANT
NEEDLE HYPO 22GX1.5 SAFETY (NEEDLE) IMPLANT
NEEDLE HYPO 25X1 1.5 SAFETY (NEEDLE) ×4 IMPLANT
NS IRRIG 1000ML POUR BTL (IV SOLUTION) ×4 IMPLANT
PACK BASIN DAY SURGERY FS (CUSTOM PROCEDURE TRAY) ×4 IMPLANT
PAD CAST 3X4 CTTN HI CHSV (CAST SUPPLIES) IMPLANT
PAD CAST 4YDX4 CTTN HI CHSV (CAST SUPPLIES) IMPLANT
PADDING CAST ABS 4INX4YD NS (CAST SUPPLIES) ×2
PADDING CAST ABS COTTON 4X4 ST (CAST SUPPLIES) ×2 IMPLANT
PADDING CAST COTTON 3X4 STRL (CAST SUPPLIES)
PADDING CAST COTTON 4X4 STRL (CAST SUPPLIES)
SLEEVE SCD COMPRESS KNEE MED (MISCELLANEOUS) IMPLANT
SPLINT PLASTER CAST XFAST 4X15 (CAST SUPPLIES) IMPLANT
SPLINT PLASTER XTRA FAST SET 4 (CAST SUPPLIES)
STOCKINETTE 4X48 STRL (DRAPES) ×4 IMPLANT
SUT ETHILON 3 0 PS 1 (SUTURE) IMPLANT
SUT ETHILON 4 0 PS 2 18 (SUTURE) ×4 IMPLANT
SUT MERSILENE 4 0 P 3 (SUTURE) IMPLANT
SUT VIC AB 3-0 PS1 18 (SUTURE)
SUT VIC AB 3-0 PS1 18XBRD (SUTURE) IMPLANT
SUT VICRYL 4-0 PS2 18IN ABS (SUTURE) IMPLANT
SYR BULB 3OZ (MISCELLANEOUS) ×4 IMPLANT
SYR CONTROL 10ML LL (SYRINGE) ×3 IMPLANT
TOWEL OR 17X24 6PK STRL BLUE (TOWEL DISPOSABLE) ×8 IMPLANT
UNDERPAD 30X30 INCONTINENT (UNDERPADS AND DIAPERS) ×4 IMPLANT

## 2014-02-07 NOTE — Op Note (Signed)
[id number] 

## 2014-02-07 NOTE — Anesthesia Preprocedure Evaluation (Addendum)
Anesthesia Evaluation  Patient identified by MRN, date of birth, ID band Patient awake    Reviewed: Allergy & Precautions, H&P , NPO status , Patient's Chart, lab work & pertinent test results  Airway Mallampati: I  TM Distance: >3 FB Neck ROM: Full    Dental  (+) Teeth Intact, Dental Advisory Given   Pulmonary neg pulmonary ROS,          Cardiovascular     Neuro/Psych Anxiety Depression Bipolar Disorder negative neurological ROS     GI/Hepatic PUD, GERD-  ,(+) Hepatitis -  Endo/Other  negative endocrine ROS  Renal/GU negative Renal ROS     Musculoskeletal  (+) Arthritis -,   Abdominal   Peds  Hematology negative hematology ROS (+)   Anesthesia Other Findings   Reproductive/Obstetrics                            Anesthesia Physical Anesthesia Plan  ASA: II  Anesthesia Plan: General   Post-op Pain Management:    Induction: Intravenous  Airway Management Planned: LMA  Additional Equipment:   Intra-op Plan:   Post-operative Plan:   Informed Consent: I have reviewed the patients History and Physical, chart, labs and discussed the procedure including the risks, benefits and alternatives for the proposed anesthesia with the patient or authorized representative who has indicated his/her understanding and acceptance.   Dental advisory given  Plan Discussed with: CRNA and Surgeon  Anesthesia Plan Comments:       Anesthesia Quick Evaluation

## 2014-02-07 NOTE — Op Note (Signed)
Intra-operative fluoroscopic images in the AP, lateral, and oblique views were taken and evaluated by myself.  Reduction and hardware placement were confirmed.  There was no intraarticular penetration of permanent hardware.  

## 2014-02-07 NOTE — Discharge Instructions (Addendum)

## 2014-02-07 NOTE — Brief Op Note (Signed)
02/07/2014  11:17 AM  PATIENT:  Berline ChoughKeith A Dickenson  48 y.o. male  PRE-OPERATIVE DIAGNOSIS:  RIGHT RING PROXIMAL PHALANX FRACTURE  POST-OPERATIVE DIAGNOSIS:  RIGHT RING PROXIMAL PHALANX FRACTURE  PROCEDURE:  Procedure(s): CLOSED REDUCTION FINGER WITH PERCUTANEOUS PINNING VS ORIF RIGHT RING FINGER PROXIMAL PHALANX (Right)  SURGEON:  Surgeon(s) and Role:    * Betha LoaKevin Alie Hardgrove, MD - Primary  PHYSICIAN ASSISTANT:   ASSISTANTS: none   ANESTHESIA:   general  EBL:  Total I/O In: 200 [I.V.:200] Out: -   BLOOD ADMINISTERED:none  DRAINS: none   LOCAL MEDICATIONS USED:  MARCAINE     SPECIMEN:  No Specimen  DISPOSITION OF SPECIMEN:  N/A  COUNTS:  YES  TOURNIQUET:   Total Tourniquet Time Documented: Upper Arm (Right) - 16 minutes Total: Upper Arm (Right) - 16 minutes   DICTATION: .Other Dictation: Dictation Number [id number]  PLAN OF CARE: Discharge to home after PACU  PATIENT DISPOSITION:  PACU - hemodynamically stable.

## 2014-02-07 NOTE — Transfer of Care (Signed)
Immediate Anesthesia Transfer of Care Note  Patient: Jordan Herrera  Procedure(s) Performed: Procedure(s): CLOSED REDUCTION FINGER WITH PERCUTANEOUS PINNING VS ORIF RIGHT RING FINGER PROXIMAL PHALANX (Right)  Patient Location: PACU  Anesthesia Type:General  Level of Consciousness: sedated  Airway & Oxygen Therapy: Patient Spontanous Breathing and Patient connected to face mask oxygen  Post-op Assessment: Report given to PACU RN and Post -op Vital signs reviewed and stable  Post vital signs: Reviewed and stable  Complications: No apparent anesthesia complications

## 2014-02-07 NOTE — Anesthesia Postprocedure Evaluation (Signed)
  Anesthesia Post-op Note  Patient: Jordan Herrera  Procedure(s) Performed: Procedure(s): CLOSED REDUCTION FINGER WITH PERCUTANEOUS PINNING VS ORIF RIGHT RING FINGER PROXIMAL PHALANX (Right)  Patient Location: PACU  Anesthesia Type:General  Level of Consciousness: awake, alert  and oriented  Airway and Oxygen Therapy: Patient Spontanous Breathing  Post-op Pain: none  Post-op Assessment: Post-op Vital signs reviewed  Post-op Vital Signs: Reviewed  Last Vitals:  Filed Vitals:   02/07/14 1200  BP:   Pulse: 83  Temp:   Resp: 19    Complications: No apparent anesthesia complications

## 2014-02-07 NOTE — Anesthesia Procedure Notes (Signed)
Procedure Name: LMA Insertion Date/Time: 02/07/2014 10:47 AM Performed by: Burna CashONRAD, Johnie Makki C Pre-anesthesia Checklist: Patient identified, Emergency Drugs available, Suction available and Patient being monitored Patient Re-evaluated:Patient Re-evaluated prior to inductionOxygen Delivery Method: Circle System Utilized Preoxygenation: Pre-oxygenation with 100% oxygen Intubation Type: IV induction Ventilation: Mask ventilation without difficulty LMA: LMA inserted LMA Size: 5.0 Number of attempts: 1 Airway Equipment and Method: bite block Placement Confirmation: positive ETCO2 Tube secured with: Tape Dental Injury: Teeth and Oropharynx as per pre-operative assessment

## 2014-02-07 NOTE — H&P (Signed)
  Jordan Herrera is an 48 y.o. male.   Chief Complaint: right ring finger fracture HPI: 48 yo rhd male states he injured right ring finger while breaking up dog fight 02/02/14.  Seen at ED where XR revealed right ring finger proximal phalanx fracture.  Splinted and followed up in office.  Previous injury to right hand and ring finger with residual inability to flex at dip joint.  No wounds.  He wishes to have fixation of the fracture.  Past Medical History  Diagnosis Date  . Depression   . Anxiety   . Eating disorder   . PUD (peptic ulcer disease)     rupture  . Bipolar 1 disorder   . Unspecified viral hepatitis C without hepatic coma   . Neuromuscular disorder     rt hand from previous injury    Past Surgical History  Procedure Laterality Date  . Ruptured gastric ulcer  1998  . Rt hand surgery  1995    cut-repaired tendons arteries rt 3 and 4 fingers  . Tonsillectomy      Family History  Problem Relation Age of Onset  . Adopted: Yes   Social History:  reports that he has never smoked. His smokeless tobacco use includes Snuff and Chew. He reports that he uses illicit drugs (Other-see comments, Cocaine, and Marijuana). He reports that he does not drink alcohol.  Allergies: No Known Allergies  Medications Prior to Admission  Medication Sig Dispense Refill  . ALPRAZolam (XANAX) 1 MG tablet TAKE 1 TABLET BY MOUTH TWICE A DAY AS NEEDED 60 tablet 5  . buPROPion (WELLBUTRIN XL) 300 MG 24 hr tablet TAKE 1 TABLET (300 MG TOTAL) BY MOUTH DAILY. (Patient taking differently: TAKE 1 TABLET (300 MG TOTAL) BY MOUTH evening) 30 tablet 2  . meloxicam (MOBIC) 15 MG tablet ONE TAB BY MOUTH AMWITH BREAKFAST FOR 2 WEEKS, THEN DAILY AS NEEDED FOR PAIN PAIN. 30 tablet 3  . omeprazole (PRILOSEC) 40 MG capsule     . promethazine (PHENERGAN) 25 MG tablet     . QUEtiapine (SEROQUEL) 25 MG tablet TAKE 1 TABLET (25 MG TOTAL) BY MOUTH AT BEDTIME. 30 tablet 5  . imiquimod (ALDARA) 5 % cream APPLY  TOPICALLY 3 X A WEEK UNTIL CLEAR OR X 16 WEEKS WICHEVER COMES FIRST 12 each 1    No results found for this or any previous visit (from the past 48 hour(s)).  No results found.   A comprehensive review of systems was negative except for: Eyes: positive for contacts/glasses Behavioral/Psych: positive for anxiety and depression  Height 6' (1.829 m), weight 79.379 kg (175 lb).  General appearance: alert, cooperative and appears stated age Head: Normocephalic, without obvious abnormality, atraumatic Neck: supple, symmetrical, trachea midline Resp: clear to auscultation bilaterally Cardio: regular rate and rhythm GI: non tender Extremities: intact sensation and capillary refill all digits.  +epl/fpl/io.  no wounds.   Pulses: 2+ and symmetric Skin: Skin color, texture, turgor normal. No rashes or lesions Neurologic: Grossly normal Incision/Wound: none  Assessment/Plan Right ring finger proximal phalanx fracture.  Non operative and operative treatment options were discussed with the patient and patient wishes to proceed with operative treatment. Risks, benefits, and alternatives of surgery were discussed and the patient agrees with the plan of care.   Jordan Herrera R 02/07/2014, 8:33 AM

## 2014-02-11 ENCOUNTER — Encounter (HOSPITAL_BASED_OUTPATIENT_CLINIC_OR_DEPARTMENT_OTHER): Payer: Self-pay | Admitting: Orthopedic Surgery

## 2014-02-13 ENCOUNTER — Encounter (HOSPITAL_BASED_OUTPATIENT_CLINIC_OR_DEPARTMENT_OTHER): Payer: Self-pay | Admitting: Orthopedic Surgery

## 2014-02-15 NOTE — Op Note (Signed)
NAME:  Jordan Herrera, Jordan Herrera                ACCOUNT NO.:  MEDICAL RECORD NO.:  [id number]  LOCATION:                                 FACILITY:  PHYSICIAN:  Betha LoaKevin Terryon Pineiro, MD        DATE OF BIRTH:  06/09/1965  DATE OF PROCEDURE:  02/07/2014 DATE OF DISCHARGE:                              OPERATIVE REPORT   PREOPERATIVE DIAGNOSIS:  Right ring finger proximal phalanx fracture.  POSTOPERATIVE DIAGNOSIS:  Right ring finger proximal phalanx fracture.  PROCEDURE:  Closed reduction and percutaneous pinning of right ring finger proximal phalanx fracture.  SURGEON:  Betha LoaKevin Heide Brossart, MD  ASSISTANT:  None.  ANESTHESIA:  General.  IV FLUIDS:  Per anesthesia flow sheet.  ESTIMATED BLOOD LOSS:  Minimal.  COMPLICATIONS:  None.  SPECIMENS:  None.  TOURNIQUET TIME:  16 minutes.  DISPOSITION:  Stable to PACU.  INDICATIONS:  Mr. Jordan Herrera is a 48 year old right-hand dominant male who states he injured his right hand while breaking up his dogs fighting last week.  He was seen at the emergency department where radiographs were taken revealing a ring finger proximal phalanx fracture.  He was splinted and follow up in the office.  There was angulation of the fracture.  Recommended closed reduction and percutaneous pinning. Risks, benefits and alternatives of the surgery were discussed including the risk of blood loss; infection; damage to nerves, vessels, tendons, ligaments, bone; failure of surgery; need for additional surgery; complications with wound healing; continued pain; nonunion; malunion; stiffness.  He voiced understanding of these risks and elected to proceed.  OPERATIVE COURSE:  After being identified preoperatively by myself, the patient and I agreed upon the procedure and site of procedure.  Surgical site was marked.  The risks, benefits, and alternatives of the surgery were reviewed and he wished to proceed.  Surgical consent had been signed.  He was given IV Ancef as  preoperative antibiotic prophylaxis. He was transferred to the operating room and placed on the operating room table in supine position with the right upper extremity on an armboard.  General anesthesia was induced by Anesthesiology.  Right upper extremity was prepped and draped in normal sterile orthopedic fashion.  A surgical pause was performed between the surgeons, anesthesia, and operating room staff, and all were in agreement as to the patient, procedure, and site of procedure.  Tourniquet at the proximal aspect of the extremity was inflated to 250 mmHg after exsanguination of the limb with an Esmarch bandage.  C-arm was used in AP, lateral, and oblique projections throughout the case to aid in reduction and position of hardware.  A closed reduction of the right ring finger proximal phalanx fracture was performed.  Good alignment was obtained.  Two 0.035-inch K-wires were then advanced from the base of the proximal phalanx across the fracture site into the distal aspect of the proximal phalanx in crossed fashion.  This was adequate to stabilize the fracture.  C-arm was used in AP, lateral, and oblique projections to ensure appropriate reduction and position of hardware, which was the case.  The wrist was placed through a tenodesis and there was no scissoring.  The pins were bent and cut short.  The pin sites were dressed with sterile Xeroform, 4x4s, and wrapped with a Kerlix bandage. A volar and dorsal splint including the long, ring, and small fingers with the MPs flexed and IPs extended was placed and wrapped with Kerlix and Ace bandage.  Tourniquet was deflated at 16 minutes.  Fingertips were pink with brisk capillary refill after deflation of the tourniquet. Operative drapes were broken down and the patient was awoken from anesthesia safely.  He was transferred back to the stretcher and taken to PACU in stable condition.  I will see him back in the office 1 week for  postoperative followup.  I will give him Percocet 5/325, 1-2 p.o. q.6 hours p.r.n. pain, dispensed #40.     Betha LoaKevin Kianni Lheureux, MD     KK/MEDQ  D:  02/07/2014  T:  02/07/2014  Job:  [id number]

## 2019-03-31 ENCOUNTER — Other Ambulatory Visit: Payer: Self-pay

## 2019-03-31 ENCOUNTER — Encounter (HOSPITAL_COMMUNITY): Payer: Self-pay

## 2019-03-31 ENCOUNTER — Emergency Department (HOSPITAL_COMMUNITY)
Admission: EM | Admit: 2019-03-31 | Discharge: 2019-03-31 | Disposition: A | Discharge status: Home / Self Care | Payer: Medicare Other | Attending: Emergency Medicine | Admitting: Emergency Medicine

## 2019-03-31 ENCOUNTER — Encounter (HOSPITAL_BASED_OUTPATIENT_CLINIC_OR_DEPARTMENT_OTHER): Payer: Self-pay | Admitting: *Deleted

## 2019-03-31 ENCOUNTER — Emergency Department (HOSPITAL_BASED_OUTPATIENT_CLINIC_OR_DEPARTMENT_OTHER)
Admission: EM | Admit: 2019-03-31 | Discharge: 2019-03-31 | Disposition: A | Discharge status: Home / Self Care | Payer: Medicare Other | Attending: Emergency Medicine | Admitting: Emergency Medicine

## 2019-03-31 DIAGNOSIS — Z79899 Other long term (current) drug therapy: Secondary | ICD-10-CM | POA: Diagnosis not present

## 2019-03-31 DIAGNOSIS — R600 Localized edema: Secondary | ICD-10-CM | POA: Insufficient documentation

## 2019-03-31 DIAGNOSIS — Z5321 Procedure and treatment not carried out due to patient leaving prior to being seen by health care provider: Secondary | ICD-10-CM | POA: Insufficient documentation

## 2019-03-31 DIAGNOSIS — K029 Dental caries, unspecified: Secondary | ICD-10-CM | POA: Insufficient documentation

## 2019-03-31 DIAGNOSIS — K0889 Other specified disorders of teeth and supporting structures: Secondary | ICD-10-CM | POA: Insufficient documentation

## 2019-03-31 DIAGNOSIS — F17220 Nicotine dependence, chewing tobacco, uncomplicated: Secondary | ICD-10-CM | POA: Insufficient documentation

## 2019-03-31 DIAGNOSIS — R220 Localized swelling, mass and lump, head: Secondary | ICD-10-CM | POA: Insufficient documentation

## 2019-03-31 MED ORDER — KETOROLAC TROMETHAMINE 30 MG/ML IJ SOLN
30.0000 mg | Freq: Once | INTRAMUSCULAR | Status: AC
  Administered 2019-03-31: 30 mg via INTRAMUSCULAR
  Filled 2019-03-31: qty 1

## 2019-03-31 MED ORDER — NAPROXEN 500 MG PO TABS: 500.0000 mg | ORAL_TABLET | Freq: Two times a day (BID) | ORAL | 0 refills | Status: AC

## 2019-03-31 MED ORDER — PENICILLIN V POTASSIUM 500 MG PO TABS: 500.0000 mg | ORAL_TABLET | Freq: Three times a day (TID) | ORAL | 0 refills | Status: AC

## 2019-03-31 MED ORDER — PENICILLIN V POTASSIUM 250 MG PO TABS
500.0000 mg | ORAL_TABLET | Freq: Once | ORAL | Status: AC
  Administered 2019-03-31: 500 mg via ORAL
  Filled 2019-03-31: qty 2

## 2019-03-31 MED ORDER — HYDROCODONE-ACETAMINOPHEN 5-325 MG PO TABS: 1.0000 | ORAL_TABLET | ORAL | 0 refills | Status: AC | PRN

## 2019-03-31 MED ORDER — HYDROCODONE-ACETAMINOPHEN 5-325 MG PO TABS
1.0000 | ORAL_TABLET | Freq: Once | ORAL | Status: AC
Start: 2019-03-31 — End: 2019-03-31
  Administered 2019-03-31: 1 via ORAL
  Filled 2019-03-31: qty 1

## 2019-03-31 NOTE — Discharge Instructions (Addendum)
You likely have a dental abscess. Take the antibiotics as prescribed. Warm compress to face. I have also prescribed an anti-inflammatory as well as some pain medicine. Please only take as prescribed. Follow-up with dentistry for definitive management.

## 2019-03-31 NOTE — ED Triage Notes (Addendum)
Patient c/o right lower dental pain since yesterday. Patieant states he has an appointment with a dentist on Monday, but was told to come to the ED because of the increased pain and swelling.  Patient denies any breathing or swallowing issues.

## 2019-03-31 NOTE — ED Provider Notes (Signed)
[facility] EMERGENCY DEPARTMENT Provider Note   CSN: [id number] Arrival date & time: 03/31/19  1949     History Chief Complaint  Patient presents with  . Facial Swelling    Jordan Herrera is a 54 y.o. male with past medical history significant for bipolar disorder, PUD, hepatitis C who presents for evaluation of facial swelling and dental pain. Patient states he has had dental pain and facial swelling x1 week. States he was seen by dentistry on Friday and he was sent to an oral surgeon however this is out of network and he was not able to have his tooth pulled. He was not started on antibiotics. He has been taking meloxicam for his pain. He denies fever, chills, nausea, vomiting, drooling, dysphagia, trismus, neck pain, neck stiffness. He has been tolerating p.o. intake at home without difficulty. Pain located to right lower jaw. He has known cavity to tooth just prior to his molar. Patient states he has a cousin who is a Engineer, petroleum who is going to remove his tooth on Tuesday. Rates his pain a 10/10. No facial erythema, warmth, fluctuance or induration. Denies additional aggravating or alleviating factors.  Of note patient originally presented to [facility] emergency department. According to their triage note his dental pain and swelling began yesterday. Stated he had an appointment with a dentist on Monday however he was not able to be evaluated and he was told to come to the emergency department for possible antibiotics.  HPI     Past Medical History:  Diagnosis Date  . Anxiety   . Bipolar 1 disorder (HCC)   . Depression   . Eating disorder   . Neuromuscular disorder (HCC)    rt hand from previous injury  . PUD (peptic ulcer disease)    rupture  . Unspecified viral hepatitis C without hepatic coma     Patient Active Problem List   Diagnosis Date Noted  . Osteoarthritis of left hip 06/05/2012  . Mood disorder (HCC) 02/10/2011  . Penile wart 08/12/2010  .  GERD, SEVERE 12/10/2009  . HAND PAIN, RIGHT 12/18/2008  . SHOULDER PAIN, LEFT 02/22/2008  . 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 GAD (generalized anxiety disorder) 12/22/2007  . PEPTIC ULCER DISEASE 12/22/2007  . LOSS OF WEIGHT 12/22/2007    Past Surgical History:  Procedure Laterality Date  . CLOSED REDUCTION METACARPAL WITH PERCUTANEOUS PINNING Right 02/07/2014   Procedure: CLOSED REDUCTION FINGER WITH PERCUTANEOUS PINNING;  Surgeon: Betha Loa, MD;  Location: [facility];  Service: Orthopedics;  Laterality: Right;  . rt hand surgery  1995   cut-repaired tendons arteries rt 3 and 4 fingers  . ruptured gastric ulcer  1998  . TONSILLECTOMY         Family History  Adopted: Yes    Social History   Tobacco Use  . Smoking status: Never Smoker  . Smokeless tobacco: Current User    Types: Snuff, Chew  Substance Use Topics  . Alcohol use: No  . Drug use: Not Currently    Types: Other-see comments, Cocaine, Marijuana    Home Medications Prior to Admission medications   Medication Sig Start Date End Date Taking? Authorizing Provider  ALPRAZolam Prudy Feeler) 1 MG tablet TAKE 1 TABLET BY MOUTH TWICE A DAY AS NEEDED 12/01/12   Elaina Pattee, MD  buPROPion (WELLBUTRIN XL) 300 MG 24 hr tablet TAKE 1 TABLET (300 MG TOTAL) BY MOUTH DAILY. Patient taking differently: TAKE 1 TABLET (300 MG TOTAL) BY  MOUTH evening 11/29/12   Alberteen Sam, MD  HYDROcodone-acetaminophen (NORCO/VICODIN) 5-325 MG tablet Take 1 tablet by mouth every 4 (four) hours as needed. 03/31/19   Lenord Fralix A, PA-C  imiquimod (ALDARA) 5 % cream APPLY TOPICALLY 3 X A WEEK UNTIL CLEAR OR X 16 WEEKS WICHEVER COMES FIRST 10/06/12   Hali Marry, MD  meloxicam (MOBIC) 15 MG tablet ONE TAB BY MOUTH AMWITH BREAKFAST FOR 2 WEEKS, THEN DAILY AS NEEDED FOR PAIN PAIN. 10/06/12   Silverio Decamp, MD  naproxen (NAPROSYN) 500 MG tablet Take 1 tablet (500 mg total) by mouth 2 (two) times daily. 03/31/19   Traevion Poehler,  Manreet Kiernan A, PA-C  omeprazole (PRILOSEC) 40 MG capsule  10/04/11   [provider]  oxyCODONE-acetaminophen (PERCOCET) 5-325 MG per tablet 1-2 tabs po q6 hours prn pain 02/07/14   Leanora Cover, MD  penicillin v potassium (VEETID) 500 MG tablet Take 1 tablet (500 mg total) by mouth 3 (three) times daily for 5 days. 03/31/19 04/05/19  Vinal Rosengrant A, PA-C  promethazine (PHENERGAN) 25 MG tablet  09/08/11   [provider]  QUEtiapine (SEROQUEL) 25 MG tablet TAKE 1 TABLET (25 MG TOTAL) BY MOUTH AT BEDTIME. 10/06/12   Alberteen Sam, MD    Allergies    Oxycodone  Review of Systems   Review of Systems  Constitutional: Negative.   HENT: Positive for dental problem and facial swelling. Negative for congestion, drooling, ear discharge, ear pain, hearing loss, mouth sores, nosebleeds, postnasal drip, rhinorrhea, sinus pressure, sinus pain, sneezing, sore throat, tinnitus, trouble swallowing and voice change.   Respiratory: Negative.   Cardiovascular: Negative.   Gastrointestinal: Negative.   Genitourinary: Negative.   Musculoskeletal: Negative.   Skin: Negative.   Neurological: Negative.   All other systems reviewed and are negative.   Physical Exam Updated Vital Signs BP (!) 169/111 (BP Location: Right Arm)   Pulse 94   Temp 99 F (37.2 C) (Oral)   Resp 16   SpO2 100%   Physical Exam Vitals and nursing note reviewed.  Constitutional:      General: He is not in acute distress.    Appearance: He is well-developed. He is not ill-appearing, toxic-appearing or diaphoretic.  HENT:     Head: Normocephalic and atraumatic.     Jaw: There is normal jaw occlusion.     Comments: Right-sided facial swelling over right mandible. Swelling does not extend to the submandibular region. No facial brawning.    Mouth/Throat:     Lips: Pink.     Mouth: Mucous membranes are moist.     Dentition: Abnormal dentition. Does not have dentures. Dental tenderness and dental caries present. No  gingival swelling or dental abscesses.     Tongue: No lesions. Tongue does not deviate from midline.     Palate: No mass and lesions.     Pharynx: Oropharynx is clear. Uvula midline.      Comments: No drooling, dysphagia or trismus. Sublingual area soft. Initial erythema to right lower posterior dentition surrounding molar. Full dental caries. No fractured dentition. Uvula midline. No oral lesions. No obvious drainable periapical abscess Eyes:     Pupils: Pupils are equal, round, and reactive to light.  Neck:     Trachea: Trachea and phonation normal.     Comments: No neck stiffness or neck rigidity. Cardiovascular:     Rate and Rhythm: Normal rate and regular rhythm.     Pulses: Normal pulses.     Heart sounds:  Normal heart sounds.  Pulmonary:     Effort: Pulmonary effort is normal. No respiratory distress.     Breath sounds: Normal breath sounds and air entry.  Abdominal:     General: There is no distension.     Palpations: Abdomen is soft.     Tenderness: There is no abdominal tenderness.  Musculoskeletal:        General: Normal range of motion.     Cervical back: Full passive range of motion without pain, normal range of motion and neck supple.  Skin:    General: Skin is warm and dry.     Capillary Refill: Capillary refill takes less than 2 seconds.     Comments: Mild right mandibular facial swelling without fluctuance or induration. No erythema or warmth. No evidence of drainable abscess  Neurological:     Mental Status: He is alert.    ED Results / Procedures / Treatments   Labs (all labs ordered are listed, but only abnormal results are displayed) Labs Reviewed - No data to display  EKG None  Radiology No results found.  Procedures Procedures (including critical care time)  Medications Ordered in ED Medications  ketorolac (TORADOL) 30 MG/ML injection 30 mg (30 mg Intramuscular Given 03/31/19 2023)  HYDROcodone-acetaminophen (NORCO/VICODIN) 5-325 MG per tablet 1  tablet (1 tablet Oral Given 03/31/19 2021)  penicillin v potassium (VEETID) tablet 500 mg (500 mg Oral Given 03/31/19 2022)    ED Course  I have reviewed the triage vital signs and the nursing notes.  Pertinent labs & imaging results that were available during my care of the patient were reviewed by me and considered in my medical decision making (see chart for details).  54 year old male appears otherwise well presents for evaluation of right-sided facial swelling. Afebrile, nonseptic, non-ill-appearing. Patient with likely dental abscess. He has no drooling, dysphagia or trismus. Overall poor dentition with multiple dental caries. Some mild right gingival erythema however no gross dental abscess to drain in ED. Right-sided mandibular swelling does not extend into the submandibular region. Sublingual area soft. No oral lesions. Low suspicion for Ludwig's angina. He has no fluctuance, induration, facial erythema or warmth. He is tolerating p.o. intake without difficulty. I do not see abscess that I can drain at this time. Will refer outpatient to dentistry. Of note patient presented to [facility] emergency department and stated he had follow-up on Monday for dentistry. On arrival here patient states he has already been seen by dentistry and was referred to oral surgery. He did get quite agitated when I questioned when exactly his symptoms started and if he had actually been evaluated by dentistry. Nonetheless, patient appears overall well. I have low suspicion for deep space infection at this time. Will start antibiotics, anti-inflammatories. He did want a "shot" of pain medicine he has normal kidney function per patient and prior lab review. I did give shot of Toradol here in the emergency department as well as his first dose of antibiotics. Discussed strict return precautions. Patient voiced understanding and is agreeable follow-up. He does have mildly elevated blood pressure here in the ED however no  chest pain, shortness of breath, headache, lightheadedness, dizziness or paresthesias. Low suspicion for hypertensive urgency or emergency. He is to follow-up outpatient with PCP for reevaluation of his  The patient has been appropriately medically screened and/or stabilized in the ED. I have low suspicion for any other emergent medical condition which would require further screening, evaluation or treatment in the ED or require  inpatient management.     MDM Rules/Calculators/A&P                       Final Clinical Impression(s) / ED Diagnoses Final diagnoses:  Facial swelling  Pain, dental    Rx / DC Orders ED Discharge Orders         Ordered    HYDROcodone-acetaminophen (NORCO/VICODIN) 5-325 MG tablet  Every 4 hours PRN     03/31/19 2013    penicillin v potassium (VEETID) 500 MG tablet  3 times daily     03/31/19 2013    naproxen (NAPROSYN) 500 MG tablet  2 times daily     03/31/19 2013           Sharyn Brilliant A, PA-C 03/31/19 2029    Virgina Norfolk, DO 03/31/19 2105

## 2019-03-31 NOTE — ED Triage Notes (Signed)
Pt reports right side facial swelling x 1-2 weeks. He was supposed to have right lower tooth removed but was sent to a dentist out of network. He states he can't see a Careers adviser until mid February. He was sent here to eval for possible steroids and antibiotics
